# Patient Record
Sex: Female | Born: 1950 | Race: Black or African American | Hispanic: No | Marital: Married | Smoking: Never smoker
Health system: Southern US, Community
[De-identification: ages and names within clinical notes are randomized; demographics above are authoritative.]

## PROBLEM LIST (undated history)

## (undated) DIAGNOSIS — R001 Bradycardia, unspecified: Secondary | ICD-10-CM

## (undated) DIAGNOSIS — B1910 Unspecified viral hepatitis B without hepatic coma: Secondary | ICD-10-CM

## (undated) DIAGNOSIS — N760 Acute vaginitis: Secondary | ICD-10-CM

## (undated) DIAGNOSIS — J40 Bronchitis, not specified as acute or chronic: Secondary | ICD-10-CM

## (undated) DIAGNOSIS — M1710 Unilateral primary osteoarthritis, unspecified knee: Secondary | ICD-10-CM

## (undated) DIAGNOSIS — M8580 Other specified disorders of bone density and structure, unspecified site: Secondary | ICD-10-CM

## (undated) HISTORY — DX: Unspecified viral hepatitis B without hepatic coma: B19.10

## (undated) HISTORY — DX: Other specified disorders of bone density and structure, unspecified site: M85.80

## (undated) HISTORY — PX: BREAST BIOPSY: SHX20

## (undated) HISTORY — DX: Acute vaginitis: N76.0

## (undated) HISTORY — DX: Bradycardia, unspecified: R00.1

## (undated) HISTORY — DX: Unilateral primary osteoarthritis, unspecified knee: M17.10

## (undated) HISTORY — PX: COLONOSCOPY: SHX174

## (undated) HISTORY — DX: Bronchitis, not specified as acute or chronic: J40

---

## 2000-01-12 ENCOUNTER — Ambulatory Visit (HOSPITAL_COMMUNITY): Admission: RE | Admit: 2000-01-12 | Discharge: 2000-01-12 | Payer: Self-pay | Admitting: Obstetrics and Gynecology

## 2000-01-12 ENCOUNTER — Encounter: Payer: Self-pay | Admitting: Obstetrics and Gynecology

## 2000-02-03 ENCOUNTER — Other Ambulatory Visit: Admission: RE | Admit: 2000-02-03 | Discharge: 2000-02-03 | Payer: Self-pay | Admitting: Obstetrics

## 2000-02-03 ENCOUNTER — Encounter: Admission: RE | Admit: 2000-02-03 | Discharge: 2000-02-03 | Payer: Self-pay | Admitting: Obstetrics

## 2000-02-09 ENCOUNTER — Encounter (HOSPITAL_COMMUNITY): Admission: RE | Admit: 2000-02-09 | Discharge: 2000-05-09 | Payer: Self-pay | Admitting: *Deleted

## 2000-02-17 ENCOUNTER — Ambulatory Visit (HOSPITAL_COMMUNITY): Admission: RE | Admit: 2000-02-17 | Discharge: 2000-02-17 | Payer: Self-pay | Admitting: Obstetrics

## 2000-03-23 ENCOUNTER — Encounter: Admission: RE | Admit: 2000-03-23 | Discharge: 2000-03-23 | Payer: Self-pay | Admitting: Obstetrics & Gynecology

## 2000-03-25 ENCOUNTER — Encounter: Payer: Self-pay | Admitting: Emergency Medicine

## 2000-03-25 ENCOUNTER — Emergency Department (HOSPITAL_COMMUNITY): Admission: EM | Admit: 2000-03-25 | Discharge: 2000-03-25 | Payer: Self-pay | Admitting: Emergency Medicine

## 2000-04-27 ENCOUNTER — Encounter: Admission: RE | Admit: 2000-04-27 | Discharge: 2000-04-27 | Payer: Self-pay | Admitting: Obstetrics & Gynecology

## 2000-05-18 ENCOUNTER — Encounter: Admission: RE | Admit: 2000-05-18 | Discharge: 2000-05-18 | Payer: Self-pay | Admitting: Obstetrics & Gynecology

## 2000-07-08 ENCOUNTER — Encounter: Admission: RE | Admit: 2000-07-08 | Discharge: 2000-07-08 | Payer: Self-pay | Admitting: Obstetrics

## 2000-10-14 ENCOUNTER — Encounter: Admission: RE | Admit: 2000-10-14 | Discharge: 2000-10-14 | Payer: Self-pay | Admitting: Obstetrics

## 2001-02-17 ENCOUNTER — Encounter: Payer: Self-pay | Admitting: Obstetrics

## 2001-02-17 ENCOUNTER — Ambulatory Visit (HOSPITAL_COMMUNITY): Admission: RE | Admit: 2001-02-17 | Discharge: 2001-02-17 | Payer: Self-pay | Admitting: Obstetrics

## 2001-06-02 ENCOUNTER — Encounter: Admission: RE | Admit: 2001-06-02 | Discharge: 2001-06-02 | Payer: Self-pay | Admitting: Obstetrics

## 2002-01-11 ENCOUNTER — Encounter: Admission: RE | Admit: 2002-01-11 | Discharge: 2002-01-11 | Payer: Self-pay | Admitting: Family Medicine

## 2002-02-23 ENCOUNTER — Encounter: Payer: Self-pay | Admitting: Obstetrics

## 2002-02-23 ENCOUNTER — Ambulatory Visit (HOSPITAL_COMMUNITY): Admission: RE | Admit: 2002-02-23 | Discharge: 2002-02-23 | Payer: Self-pay | Admitting: Obstetrics

## 2003-01-12 ENCOUNTER — Encounter (INDEPENDENT_AMBULATORY_CARE_PROVIDER_SITE_OTHER): Payer: Self-pay | Admitting: *Deleted

## 2003-02-02 ENCOUNTER — Encounter: Admission: RE | Admit: 2003-02-02 | Discharge: 2003-02-02 | Payer: Self-pay | Admitting: Family Medicine

## 2003-02-28 ENCOUNTER — Encounter: Payer: Self-pay | Admitting: Obstetrics

## 2003-02-28 ENCOUNTER — Ambulatory Visit (HOSPITAL_COMMUNITY): Admission: RE | Admit: 2003-02-28 | Discharge: 2003-02-28 | Payer: Self-pay | Admitting: Obstetrics

## 2004-03-24 ENCOUNTER — Ambulatory Visit (HOSPITAL_COMMUNITY): Admission: RE | Admit: 2004-03-24 | Discharge: 2004-03-24 | Payer: Self-pay | Admitting: Internal Medicine

## 2004-05-28 ENCOUNTER — Encounter (INDEPENDENT_AMBULATORY_CARE_PROVIDER_SITE_OTHER): Payer: Self-pay | Admitting: Specialist

## 2004-05-28 ENCOUNTER — Ambulatory Visit (HOSPITAL_COMMUNITY): Admission: RE | Admit: 2004-05-28 | Discharge: 2004-05-28 | Payer: Self-pay | Admitting: Gastroenterology

## 2005-03-30 ENCOUNTER — Ambulatory Visit (HOSPITAL_COMMUNITY): Admission: RE | Admit: 2005-03-30 | Discharge: 2005-03-30 | Payer: Self-pay | Admitting: Internal Medicine

## 2005-07-14 ENCOUNTER — Ambulatory Visit (HOSPITAL_COMMUNITY): Admission: RE | Admit: 2005-07-14 | Discharge: 2005-07-14 | Payer: Self-pay | Admitting: Infectious Diseases

## 2006-01-18 ENCOUNTER — Encounter: Admission: RE | Admit: 2006-01-18 | Discharge: 2006-01-18 | Payer: Self-pay | Admitting: Internal Medicine

## 2006-04-05 ENCOUNTER — Ambulatory Visit (HOSPITAL_COMMUNITY): Admission: RE | Admit: 2006-04-05 | Discharge: 2006-04-05 | Payer: Self-pay | Admitting: Internal Medicine

## 2006-05-26 ENCOUNTER — Encounter: Admission: RE | Admit: 2006-05-26 | Discharge: 2006-05-26 | Payer: Self-pay | Admitting: Occupational Medicine

## 2006-06-14 ENCOUNTER — Encounter: Admission: RE | Admit: 2006-06-14 | Discharge: 2006-09-12 | Payer: Self-pay | Admitting: Orthopedic Surgery

## 2006-10-07 DIAGNOSIS — D259 Leiomyoma of uterus, unspecified: Secondary | ICD-10-CM | POA: Insufficient documentation

## 2006-10-07 DIAGNOSIS — E669 Obesity, unspecified: Secondary | ICD-10-CM

## 2006-10-08 ENCOUNTER — Encounter (INDEPENDENT_AMBULATORY_CARE_PROVIDER_SITE_OTHER): Payer: Self-pay | Admitting: *Deleted

## 2007-04-12 ENCOUNTER — Ambulatory Visit (HOSPITAL_COMMUNITY): Admission: RE | Admit: 2007-04-12 | Discharge: 2007-04-12 | Payer: Self-pay | Admitting: Internal Medicine

## 2008-04-19 ENCOUNTER — Ambulatory Visit (HOSPITAL_COMMUNITY): Admission: RE | Admit: 2008-04-19 | Discharge: 2008-04-19 | Payer: Self-pay | Admitting: Internal Medicine

## 2008-05-10 ENCOUNTER — Ambulatory Visit: Payer: Self-pay | Admitting: Obstetrics and Gynecology

## 2008-05-10 ENCOUNTER — Other Ambulatory Visit: Admission: RE | Admit: 2008-05-10 | Discharge: 2008-05-10 | Payer: Self-pay | Admitting: Obstetrics & Gynecology

## 2008-05-10 ENCOUNTER — Encounter: Payer: Self-pay | Admitting: Obstetrics and Gynecology

## 2008-05-15 ENCOUNTER — Ambulatory Visit (HOSPITAL_COMMUNITY): Admission: RE | Admit: 2008-05-15 | Discharge: 2008-05-15 | Payer: Self-pay | Admitting: Obstetrics and Gynecology

## 2008-05-24 ENCOUNTER — Ambulatory Visit: Payer: Self-pay | Admitting: Obstetrics & Gynecology

## 2008-06-28 ENCOUNTER — Emergency Department (HOSPITAL_COMMUNITY): Admission: EM | Admit: 2008-06-28 | Discharge: 2008-06-28 | Payer: Self-pay | Admitting: Emergency Medicine

## 2009-04-30 ENCOUNTER — Ambulatory Visit (HOSPITAL_COMMUNITY): Admission: RE | Admit: 2009-04-30 | Discharge: 2009-04-30 | Payer: Self-pay | Admitting: Internal Medicine

## 2010-05-05 ENCOUNTER — Ambulatory Visit (HOSPITAL_COMMUNITY): Admission: RE | Admit: 2010-05-05 | Discharge: 2010-05-05 | Payer: Self-pay | Admitting: Internal Medicine

## 2010-12-23 NOTE — Group Therapy Note (Signed)
Jessica Combs, Jessica Combs NO.:  [id number]   MEDICAL RECORD NO.:  [id number]          PATIENT TYPE:  WOC   LOCATION:  [facility]                   FACILITY:  [facility]   PHYSICIAN:  Argentina Donovan, MD        DATE OF BIRTH:  07-20-1951   DATE OF SERVICE:                                  CLINIC NOTE   The patient is a 60 year old [place] female, gravida 4, para 4-0-0-4, 4  years postmenopausal, who began in August with some spotting and  bleeding, almost every day in August and it stopped in September.  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  She is, otherwise, in good  health and she takes no medicines, except multivitamins.  We discussed  the problem with her and told her we need to do an endometrial biopsy.  The patient was placed in dorsolithotomy position.  The external  genitalia was normal.  BUS within normal limits.  Vagina was clean with  some loss of rugae.  The cervix is clean and slightly stenotic in  appearance with anterior normal size, shape, consistency.  Adnexa could  not be well outlined because of habitus.  The patient is 207 pounds and  5 feet 3 inches tall.  The small plastic dilator was used to dilate the  cervix, and the uterine cavity was sounded to a depth of 6 cm.  The  endometrial biopsy pipette was used and a moderate amount of tissue from  the uterine curettage and sent for pathological diagnosis.  Also, we  will get an uterine ultrasound on the patient to make sure the adnexa is  normal too.  We will have her come back in 2 weeks for results, rashes,  and postmenopausal bleeding.           ______________________________  Argentina Donovan, MD     PR/MEDQ  D:  05/10/2008  T:  05/11/2008  Job:  [id number]

## 2010-12-26 NOTE — Op Note (Signed)
NAMEJALISE, ZAWISTOWSKI        ACCOUNT NO.:  [id number]   MEDICAL RECORD NO.:  [id number]          PATIENT TYPE:  AMB   LOCATION:  ENDO                         FACILITY:  [facility]   PHYSICIAN:  Graylin Shiver, M.D.   DATE OF BIRTH:  01-Jan-1951   DATE OF PROCEDURE:  05/28/2004  DATE OF DISCHARGE:                                 OPERATIVE REPORT   PROCEDURE:  Colonoscopy with polypectomy.   ENDOSCOPIST:  Graylin Shiver, M.D.   INDICATIONS FOR PROCEDURE:  Screening.   INFORMED CONSENT:  An informed consent was obtained after the explanation of  the risks of bleeding, infection and perforation.   PREMEDICATION:  Fentanyl 62.5 mcg IV, Versed 5 mg IV.   DESCRIPTION OF PROCEDURE:  With the patient in the left lateral decubitus  position, a rectal examination was performed.  No masses were felt.  The  Olympus colonoscope was inserted into the rectum and advanced around the  colon to the cecum.  The cecal landmarks were identified.  The cecum and  ascending colon were normal.  The transverse colon was normal.  The  descending colon was normal.  The sigmoid showed a few diverticula.  In the  distal rectum there was a 5 mm sessile polyp, snared and removed by snare  cautery technique.  The cautery site looked good and the polyp was  retrieved.  She tolerated the procedure well without complications.   IMPRESSION:  1.  Diverticulosis.  2.  Rectal polyp - code #211.4.   PLAN:  The pathology will be checked.      SFG/MEDQ  D:  05/28/2004  T:  05/28/2004  Job:  [id number]   cc:   Drue Dun, M.D.

## 2011-04-23 ENCOUNTER — Other Ambulatory Visit (HOSPITAL_COMMUNITY): Payer: Self-pay | Admitting: Internal Medicine

## 2011-05-22 ENCOUNTER — Other Ambulatory Visit (HOSPITAL_COMMUNITY): Payer: Self-pay | Admitting: Internal Medicine

## 2011-06-03 ENCOUNTER — Other Ambulatory Visit (HOSPITAL_COMMUNITY): Payer: Self-pay | Admitting: Internal Medicine

## 2011-06-03 DIAGNOSIS — Z1231 Encounter for screening mammogram for malignant neoplasm of breast: Secondary | ICD-10-CM

## 2011-06-24 ENCOUNTER — Ambulatory Visit (HOSPITAL_COMMUNITY)
Admission: RE | Admit: 2011-06-24 | Discharge: 2011-06-24 | Disposition: A | Discharge status: Home / Self Care | Payer: No Typology Code available for payment source | Source: Ambulatory Visit | Attending: Internal Medicine | Admitting: Internal Medicine

## 2011-06-24 DIAGNOSIS — Z1231 Encounter for screening mammogram for malignant neoplasm of breast: Secondary | ICD-10-CM

## 2011-07-03 ENCOUNTER — Other Ambulatory Visit: Payer: Self-pay | Admitting: Internal Medicine

## 2011-07-03 DIAGNOSIS — R928 Other abnormal and inconclusive findings on diagnostic imaging of breast: Secondary | ICD-10-CM

## 2011-07-16 ENCOUNTER — Other Ambulatory Visit: Payer: Self-pay | Admitting: Obstetrics and Gynecology

## 2011-07-16 ENCOUNTER — Other Ambulatory Visit: Payer: Self-pay

## 2011-07-16 ENCOUNTER — Ambulatory Visit
Admission: RE | Admit: 2011-07-16 | Discharge: 2011-07-16 | Disposition: A | Discharge status: Home / Self Care | Payer: No Typology Code available for payment source | Source: Ambulatory Visit | Attending: Internal Medicine | Admitting: Internal Medicine

## 2011-07-16 DIAGNOSIS — R928 Other abnormal and inconclusive findings on diagnostic imaging of breast: Secondary | ICD-10-CM

## 2011-07-16 DIAGNOSIS — N63 Unspecified lump in unspecified breast: Secondary | ICD-10-CM

## 2011-07-29 ENCOUNTER — Other Ambulatory Visit: Payer: No Typology Code available for payment source

## 2011-08-10 ENCOUNTER — Other Ambulatory Visit: Payer: No Typology Code available for payment source

## 2011-09-25 ENCOUNTER — Ambulatory Visit
Admission: RE | Admit: 2011-09-25 | Discharge: 2011-09-25 | Disposition: A | Discharge status: Home / Self Care | Payer: No Typology Code available for payment source | Source: Ambulatory Visit | Attending: Obstetrics and Gynecology | Admitting: Obstetrics and Gynecology

## 2011-09-25 ENCOUNTER — Encounter (HOSPITAL_COMMUNITY): Payer: Self-pay

## 2011-09-25 ENCOUNTER — Ambulatory Visit (INDEPENDENT_AMBULATORY_CARE_PROVIDER_SITE_OTHER): Payer: Self-pay | Admitting: *Deleted

## 2011-09-25 ENCOUNTER — Other Ambulatory Visit: Payer: Self-pay | Admitting: Obstetrics and Gynecology

## 2011-09-25 VITALS — BP 138/91 | HR 74 | Temp 97.7°F | Ht 63.0 in | Wt 221.0 lb

## 2011-09-25 DIAGNOSIS — N63 Unspecified lump in unspecified breast: Secondary | ICD-10-CM

## 2011-09-25 DIAGNOSIS — N6310 Unspecified lump in the right breast, unspecified quadrant: Secondary | ICD-10-CM

## 2011-09-25 DIAGNOSIS — Z01419 Encounter for gynecological examination (general) (routine) without abnormal findings: Secondary | ICD-10-CM

## 2011-09-25 HISTORY — PX: BREAST BIOPSY: SHX20

## 2011-09-25 NOTE — Patient Instructions (Signed)
Taught patient how to perform BSE and gave educational materials to take home. Let her know [facility] will cover Pap smears every 3 years unless has a history of abnormal Pap smears. Patient is scheduled for a right breast biopsy this afternoon Friday, September 25, 2011 at 1300. Patient aware of appointment and will be there. Let patient know will follow up with her within the next couple weeks with results. Patient verbalized understanding.

## 2011-09-25 NOTE — Progress Notes (Signed)
Complaints of lump in right breast.  Pap Smear:    Completed Pap smear today. Last Pap smear was in 2011 per patient at [facility] and per patient was normal. Per patient no history of abnormal Pap smears. No Pap smear results in EPIC.  Physical exam: Breasts Breasts symmetrical. No skin abnormalities bilateral breasts. No nipple retraction bilateral breasts. No nipple discharge bilateral breasts. No lymphadenopathy. No lumps palpated within the left breast. Palpated a small lump in the right breast around 3:30 o'clock 5 cm from the nipple. No complaints of pain or tenderness on palpation. Patient is scheduled for a right breast biopsy today at 1300 at the [facility].         Pelvic/Bimanual   Ext Genitalia No lesions, no swelling and no discharge observed on external genitalia.         Vagina Vagina pink and normal texture. No lesions or discharge observed in vagina.          Cervix Cervix is present. Cervix pink and of normal texture. No discharge observed on the cervix.         Uterus Uterus is present and palpable. Uterus in normal position and normal size.       Adnexae Bilateral ovaries present and palpable. No tenderness on palpation.        Rectovaginal No rectal exam completed today since patient had no rectal complaints. No skin abnormalities observed on rectal area.

## 2011-10-08 ENCOUNTER — Encounter: Payer: Self-pay | Admitting: Obstetrics and Gynecology

## 2012-01-14 ENCOUNTER — Emergency Department (HOSPITAL_COMMUNITY): Payer: No Typology Code available for payment source

## 2012-01-14 ENCOUNTER — Emergency Department (HOSPITAL_COMMUNITY)
Admission: EM | Admit: 2012-01-14 | Discharge: 2012-01-14 | Disposition: A | Discharge status: Home / Self Care | Payer: No Typology Code available for payment source | Attending: Emergency Medicine | Admitting: Emergency Medicine

## 2012-01-14 ENCOUNTER — Encounter (HOSPITAL_COMMUNITY): Payer: Self-pay | Admitting: Emergency Medicine

## 2012-01-14 DIAGNOSIS — M542 Cervicalgia: Secondary | ICD-10-CM | POA: Insufficient documentation

## 2012-01-14 DIAGNOSIS — M25519 Pain in unspecified shoulder: Secondary | ICD-10-CM | POA: Insufficient documentation

## 2012-01-14 DIAGNOSIS — M62838 Other muscle spasm: Secondary | ICD-10-CM | POA: Insufficient documentation

## 2012-01-14 DIAGNOSIS — M25512 Pain in left shoulder: Secondary | ICD-10-CM

## 2012-01-14 DIAGNOSIS — Y92410 Unspecified street and highway as the place of occurrence of the external cause: Secondary | ICD-10-CM | POA: Insufficient documentation

## 2012-01-14 MED ORDER — CYCLOBENZAPRINE HCL 5 MG PO TABS: 5.0000 mg | ORAL_TABLET | Freq: Three times a day (TID) | ORAL | Status: AC | PRN

## 2012-01-14 NOTE — ED Provider Notes (Signed)
I saw and evaluated the patient, reviewed the resident's note and I agree with the findings and plan.   Mahlon Gabrielle, MD 01/14/12 2307 
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
## 2012-01-22 ENCOUNTER — Telehealth: Payer: Self-pay | Admitting: *Deleted

## 2012-01-22 NOTE — Telephone Encounter (Signed)
Patient called stating having right breast pain and describes as a dull pain. No lump or discharge from breast. Patient states has changed to a new bra but has not helped. Advised patient would check with Jessica Combs on Monday June 17 and see what options would be available.

## 2012-01-25 ENCOUNTER — Telehealth: Payer: Self-pay | Admitting: *Deleted

## 2012-01-25 NOTE — Telephone Encounter (Signed)
Patient called stating she is having occasional right breast pain. Patient states there are no new lumps. Asked patient if drinks caffeine and patient drinks coffee and tea daily. Recommended to patient that she decrease her caffeine intake. Told patient not to stop drinking caffeine abruptly due to possible headaches from caffeine withdrawal. Told patient if after cutting back or stops drinking caffeine still has the breast pain to call me back. Patient verbalized understanding.

## 2012-06-06 ENCOUNTER — Other Ambulatory Visit: Payer: Self-pay | Admitting: Internal Medicine

## 2012-06-06 DIAGNOSIS — Z1231 Encounter for screening mammogram for malignant neoplasm of breast: Secondary | ICD-10-CM

## 2012-06-28 ENCOUNTER — Ambulatory Visit
Admission: RE | Admit: 2012-06-28 | Discharge: 2012-06-28 | Disposition: A | Discharge status: Home / Self Care | Payer: No Typology Code available for payment source | Source: Ambulatory Visit | Attending: Internal Medicine | Admitting: Internal Medicine

## 2012-06-28 DIAGNOSIS — Z1231 Encounter for screening mammogram for malignant neoplasm of breast: Secondary | ICD-10-CM

## 2012-07-01 ENCOUNTER — Other Ambulatory Visit: Payer: Self-pay | Admitting: Internal Medicine

## 2012-07-01 DIAGNOSIS — R928 Other abnormal and inconclusive findings on diagnostic imaging of breast: Secondary | ICD-10-CM

## 2012-07-13 ENCOUNTER — Ambulatory Visit
Admission: RE | Admit: 2012-07-13 | Discharge: 2012-07-13 | Disposition: A | Discharge status: Home / Self Care | Payer: No Typology Code available for payment source | Source: Ambulatory Visit | Attending: Internal Medicine | Admitting: Internal Medicine

## 2012-07-13 DIAGNOSIS — R928 Other abnormal and inconclusive findings on diagnostic imaging of breast: Secondary | ICD-10-CM

## 2012-11-28 ENCOUNTER — Ambulatory Visit
Admission: RE | Admit: 2012-11-28 | Discharge: 2012-11-28 | Disposition: A | Discharge status: Home / Self Care | Payer: No Typology Code available for payment source | Source: Ambulatory Visit | Attending: *Deleted | Admitting: *Deleted

## 2012-11-28 ENCOUNTER — Other Ambulatory Visit: Payer: Self-pay | Admitting: *Deleted

## 2012-11-28 DIAGNOSIS — R7611 Nonspecific reaction to tuberculin skin test without active tuberculosis: Secondary | ICD-10-CM

## 2012-12-19 ENCOUNTER — Other Ambulatory Visit: Payer: Self-pay | Admitting: Internal Medicine

## 2012-12-19 DIAGNOSIS — N6489 Other specified disorders of breast: Secondary | ICD-10-CM

## 2012-12-30 ENCOUNTER — Other Ambulatory Visit: Payer: Self-pay | Admitting: *Deleted

## 2012-12-30 DIAGNOSIS — Z09 Encounter for follow-up examination after completed treatment for conditions other than malignant neoplasm: Secondary | ICD-10-CM

## 2013-01-03 ENCOUNTER — Ambulatory Visit (HOSPITAL_COMMUNITY)
Admission: RE | Admit: 2013-01-03 | Discharge: 2013-01-03 | Disposition: A | Discharge status: Home / Self Care | Payer: Self-pay | Source: Ambulatory Visit | Attending: Obstetrics and Gynecology | Admitting: Obstetrics and Gynecology

## 2013-01-03 ENCOUNTER — Encounter (HOSPITAL_COMMUNITY): Payer: Self-pay

## 2013-01-03 VITALS — BP 122/80 | Temp 98.1°F | Ht 63.0 in | Wt 206.8 lb

## 2013-01-03 DIAGNOSIS — Z1239 Encounter for other screening for malignant neoplasm of breast: Secondary | ICD-10-CM

## 2013-01-03 NOTE — Progress Notes (Signed)
No complaints today.  Pap Smear:    Pap smear not completed today. Last Pap smear was 09/25/2011 at [facility] and normal. Per patient no history of an abnormal Pap smear. Last Pap smear result in EPIC.  Physical exam: Breasts Breasts symmetrical. No skin abnormalities bilateral breasts. No nipple retraction bilateral breasts. No nipple discharge bilateral breasts. No lymphadenopathy. No lumps palpated bilateral breasts. No complaints of pain or tenderness on exam. Referred patient to the [facility] for 6 month follow up right breast diagnostic mammogram and possible ultrasound per recommendation. Appointment scheduled for Wednesday, January 11, 2013 at 1010.    Pelvic/Bimanual No Pap smear completed today since last Pap smear was 09/25/2011. Pap smear not indicated per BCCCP guidelines.

## 2013-01-03 NOTE — Patient Instructions (Signed)
Taught patient how to perform BSE. Patient did not need a Pap smear today due to last Pap smear was 09/25/2011. Let her know BCCCP will cover Pap smears every 3 years unless has a history of abnormal Pap smears. Referred patient to the [facility] for 6 month follow up right breast diagnostic mammogram and possible ultrasound per recommendation. Appointment scheduled for Wednesday, January 11, 2013 at 1010. Patient aware of appointment and will be there. Patient verbalized understanding.

## 2013-01-10 ENCOUNTER — Other Ambulatory Visit: Payer: Self-pay | Admitting: Oncology

## 2013-01-11 ENCOUNTER — Ambulatory Visit
Admission: RE | Admit: 2013-01-11 | Discharge: 2013-01-11 | Disposition: A | Discharge status: Home / Self Care | Payer: No Typology Code available for payment source | Source: Ambulatory Visit | Attending: Internal Medicine | Admitting: Internal Medicine

## 2013-01-11 DIAGNOSIS — N6489 Other specified disorders of breast: Secondary | ICD-10-CM

## 2013-06-19 ENCOUNTER — Other Ambulatory Visit: Payer: Self-pay

## 2013-06-19 DIAGNOSIS — Z1231 Encounter for screening mammogram for malignant neoplasm of breast: Secondary | ICD-10-CM

## 2013-07-20 ENCOUNTER — Ambulatory Visit
Admission: RE | Admit: 2013-07-20 | Discharge: 2013-07-20 | Disposition: A | Discharge status: Home / Self Care | Payer: No Typology Code available for payment source | Source: Ambulatory Visit

## 2013-07-20 DIAGNOSIS — Z1231 Encounter for screening mammogram for malignant neoplasm of breast: Secondary | ICD-10-CM

## 2013-10-16 ENCOUNTER — Other Ambulatory Visit (HOSPITAL_COMMUNITY): Payer: Self-pay | Admitting: General Practice

## 2013-10-16 ENCOUNTER — Ambulatory Visit (HOSPITAL_COMMUNITY)
Admission: RE | Admit: 2013-10-16 | Discharge: 2013-10-16 | Disposition: A | Discharge status: Home / Self Care | Payer: Self-pay | Source: Ambulatory Visit | Attending: General Practice | Admitting: General Practice

## 2013-10-16 DIAGNOSIS — R001 Bradycardia, unspecified: Secondary | ICD-10-CM

## 2013-10-16 DIAGNOSIS — I498 Other specified cardiac arrhythmias: Secondary | ICD-10-CM | POA: Insufficient documentation

## 2013-10-16 DIAGNOSIS — I517 Cardiomegaly: Secondary | ICD-10-CM | POA: Insufficient documentation

## 2014-06-11 ENCOUNTER — Encounter (HOSPITAL_COMMUNITY): Payer: Self-pay

## 2015-01-03 ENCOUNTER — Ambulatory Visit (INDEPENDENT_AMBULATORY_CARE_PROVIDER_SITE_OTHER): Payer: PRIVATE HEALTH INSURANCE | Admitting: Nurse Practitioner

## 2015-01-03 ENCOUNTER — Encounter: Payer: Self-pay | Admitting: Nurse Practitioner

## 2015-01-03 VITALS — BP 118/78 | HR 61 | Temp 98.4°F | Resp 20 | Ht 62.01 in | Wt 215.2 lb

## 2015-01-03 DIAGNOSIS — Z0000 Encounter for general adult medical examination without abnormal findings: Secondary | ICD-10-CM | POA: Diagnosis not present

## 2015-01-03 DIAGNOSIS — Z1211 Encounter for screening for malignant neoplasm of colon: Secondary | ICD-10-CM

## 2015-01-03 DIAGNOSIS — Z1239 Encounter for other screening for malignant neoplasm of breast: Secondary | ICD-10-CM

## 2015-01-03 DIAGNOSIS — Z23 Encounter for immunization: Secondary | ICD-10-CM

## 2015-01-03 MED ORDER — TETANUS-DIPHTH-ACELL PERTUSSIS 5-2.5-18.5 LF-MCG/0.5 IM SUSP: 0.5000 mL | Freq: Once | INTRAMUSCULAR | Status: DC

## 2015-01-03 NOTE — Patient Instructions (Signed)
Fasting blood work tomorrow Need screening colonoscopy and mammogram      Health Maintenance Adopting a healthy lifestyle and getting preventive care can go a long way to promote health and wellness. Talk with your health care provider about what schedule of regular examinations is right for you. This is a good chance for you to check in with your provider about disease prevention and staying healthy. In between checkups, there are plenty of things you can do on your own. Experts have done a lot of research about which lifestyle changes and preventive measures are most likely to keep you healthy. Ask your health care provider for more information. WEIGHT AND DIET  Eat a healthy diet  Be sure to include plenty of vegetables, fruits, low-fat dairy products, and lean protein.  Do not eat a lot of foods high in solid fats, added sugars, or salt.  Get regular exercise. This is one of the most important things you can do for your health.  Most adults should exercise for at least 150 minutes each week. The exercise should increase your heart rate and make you sweat (moderate-intensity exercise).  Most adults should also do strengthening exercises at least twice a week. This is in addition to the moderate-intensity exercise.  Maintain a healthy weight  Body mass index (BMI) is a measurement that can be used to identify possible weight problems. It estimates body fat based on height and weight. Your health care provider can help determine your BMI and help you achieve or maintain a healthy weight.  For females 72 years of age and older:   A BMI below 18.5 is considered underweight.  A BMI of 18.5 to 24.9 is normal.  A BMI of 25 to 29.9 is considered overweight.  A BMI of 30 and above is considered obese.  Watch levels of cholesterol and blood lipids  You should start having your blood tested for lipids and cholesterol at 64 years of age, then have this test every 5 years.  You may  need to have your cholesterol levels checked more often if:  Your lipid or cholesterol levels are high.  You are older than 64 years of age.  You are at high risk for heart disease.  CANCER SCREENING   Lung Cancer  Lung cancer screening is recommended for adults 52-2 years old who are at high risk for lung cancer because of a history of smoking.  A yearly low-dose CT scan of the lungs is recommended for people who:  Currently smoke.  Have quit within the past 15 years.  Have at least a 30-pack-year history of smoking. A pack year is smoking an average of one pack of cigarettes a day for 1 year.  Yearly screening should continue until it has been 15 years since you quit.  Yearly screening should stop if you develop a health problem that would prevent you from having lung cancer treatment.  Breast Cancer  Practice breast self-awareness. This means understanding how your breasts normally appear and feel.  It also means doing regular breast self-exams. Let your health care provider know about any changes, no matter how small.  If you are in your 20s or 30s, you should have a clinical breast exam (CBE) by a health care provider every 1-3 years as part of a regular health exam.  If you are 58 or older, have a CBE every year. Also consider having a breast X-ray (mammogram) every year.  If you have a family history of breast  cancer, talk to your health care provider about genetic screening.  If you are at high risk for breast cancer, talk to your health care provider about having an MRI and a mammogram every year.  Breast cancer gene (BRCA) assessment is recommended for women who have family members with BRCA-related cancers. BRCA-related cancers include:  Breast.  Ovarian.  Tubal.  Peritoneal cancers.  Results of the assessment will determine the need for genetic counseling and BRCA1 and BRCA2 testing. Cervical Cancer Routine pelvic examinations to screen for cervical  cancer are no longer recommended for nonpregnant women who are considered low risk for cancer of the pelvic organs (ovaries, uterus, and vagina) and who do not have symptoms. A pelvic examination may be necessary if you have symptoms including those associated with pelvic infections. Ask your health care provider if a screening pelvic exam is right for you.   The Pap test is the screening test for cervical cancer for women who are considered at risk.  If you had a hysterectomy for a problem that was not cancer or a condition that could lead to cancer, then you no longer need Pap tests.  If you are older than 65 years, and you have had normal Pap tests for the past 10 years, you no longer need to have Pap tests.  If you have had past treatment for cervical cancer or a condition that could lead to cancer, you need Pap tests and screening for cancer for at least 20 years after your treatment.  If you no longer get a Pap test, assess your risk factors if they change (such as having a new sexual partner). This can affect whether you should start being screened again.  Some women have medical problems that increase their chance of getting cervical cancer. If this is the case for you, your health care provider may recommend more frequent screening and Pap tests.  The human papillomavirus (HPV) test is another test that may be used for cervical cancer screening. The HPV test looks for the virus that can cause cell changes in the cervix. The cells collected during the Pap test can be tested for HPV.  The HPV test can be used to screen women 31 years of age and older. Getting tested for HPV can extend the interval between normal Pap tests from three to five years.  An HPV test also should be used to screen women of any age who have unclear Pap test results.  After 64 years of age, women should have HPV testing as often as Pap tests.  Colorectal Cancer  This type of cancer can be detected and often  prevented.  Routine colorectal cancer screening usually begins at 64 years of age and continues through 64 years of age.  Your health care provider may recommend screening at an earlier age if you have risk factors for colon cancer.  Your health care provider may also recommend using home test kits to check for hidden blood in the stool.  A small camera at the end of a tube can be used to examine your colon directly (sigmoidoscopy or colonoscopy). This is done to check for the earliest forms of colorectal cancer.  Routine screening usually begins at age 42.  Direct examination of the colon should be repeated every 5-10 years through 64 years of age. However, you may need to be screened more often if early forms of precancerous polyps or small growths are found. Skin Cancer  Check your skin from head to toe  regularly.  Tell your health care provider about any new moles or changes in moles, especially if there is a change in a mole's shape or color.  Also tell your health care provider if you have a mole that is larger than the size of a pencil eraser.  Always use sunscreen. Apply sunscreen liberally and repeatedly throughout the day.  Protect yourself by wearing long sleeves, pants, a wide-brimmed hat, and sunglasses whenever you are outside. HEART DISEASE, DIABETES, AND HIGH BLOOD PRESSURE   Have your blood pressure checked at least every 1-2 years. High blood pressure causes heart disease and increases the risk of stroke.  If you are between 57 years and 25 years old, ask your health care provider if you should take aspirin to prevent strokes.  Have regular diabetes screenings. This involves taking a blood sample to check your fasting blood sugar level.  If you are at a normal weight and have a low risk for diabetes, have this test once every three years after 64 years of age.  If you are overweight and have a high risk for diabetes, consider being tested at a younger age or more  often. PREVENTING INFECTION  Hepatitis B  If you have a higher risk for hepatitis B, you should be screened for this virus. You are considered at high risk for hepatitis B if:  You were born in a country where hepatitis B is common. Ask your health care provider which countries are considered high risk.  Your parents were born in a high-risk country, and you have not been immunized against hepatitis B (hepatitis B vaccine).  You have HIV or AIDS.  You use needles to inject street drugs.  You live with someone who has hepatitis B.  You have had sex with someone who has hepatitis B.  You get hemodialysis treatment.  You take certain medicines for conditions, including cancer, organ transplantation, and autoimmune conditions. Hepatitis C  Blood testing is recommended for:  Everyone born from [birth year] through 1965.  Anyone with known risk factors for hepatitis C. Sexually transmitted infections (STIs)  You should be screened for sexually transmitted infections (STIs) including gonorrhea and chlamydia if:  You are sexually active and are younger than 64 years of age.  You are older than 64 years of age and your health care provider tells you that you are at risk for this type of infection.  Your sexual activity has changed since you were last screened and you are at an increased risk for chlamydia or gonorrhea. Ask your health care provider if you are at risk.  If you do not have HIV, but are at risk, it may be recommended that you take a prescription medicine daily to prevent HIV infection. This is called pre-exposure prophylaxis (PrEP). You are considered at risk if:  You are sexually active and do not regularly use condoms or know the HIV status of your partner(s).  You take drugs by injection.  You are sexually active with a partner who has HIV. Talk with your health care provider about whether you are at high risk of being infected with HIV. If you choose to begin PrEP, you  should first be tested for HIV. You should then be tested every 3 months for as long as you are taking PrEP.  PREGNANCY   If you are premenopausal and you may become pregnant, ask your health care provider about preconception counseling.  If you may become pregnant, take 400 to 800 micrograms (mcg) of  folic acid every day.  If you want to prevent pregnancy, talk to your health care provider about birth control (contraception). OSTEOPOROSIS AND MENOPAUSE   Osteoporosis is a disease in which the bones lose minerals and strength with aging. This can result in serious bone fractures. Your risk for osteoporosis can be identified using a bone density scan.  If you are 18 years of age or older, or if you are at risk for osteoporosis and fractures, ask your health care provider if you should be screened.  Ask your health care provider whether you should take a calcium or vitamin D supplement to lower your risk for osteoporosis.  Menopause may have certain physical symptoms and risks.  Hormone replacement therapy may reduce some of these symptoms and risks. Talk to your health care provider about whether hormone replacement therapy is right for you.  HOME CARE INSTRUCTIONS   Schedule regular health, dental, and eye exams.  Stay current with your immunizations.   Do not use any tobacco products including cigarettes, chewing tobacco, or electronic cigarettes.  If you are pregnant, do not drink alcohol.  If you are breastfeeding, limit how much and how often you drink alcohol.  Limit alcohol intake to no more than 1 drink per day for nonpregnant women. One drink equals 12 ounces of beer, 5 ounces of wine, or 1 ounces of hard liquor.  Do not use street drugs.  Do not share needles.  Ask your health care provider for help if you need support or information about quitting drugs.  Tell your health care provider if you often feel depressed.  Tell your health care provider if you have ever  been abused or do not feel safe at home. Document Released: 02/09/2011 Document Revised: 12/11/2013 Document Reviewed: 06/28/2013 [facility] Patient Information 2015 [place], [place]. This information is not intended to replace advice given to you by your health care provider. Make sure you discuss any questions you have with your health care provider.

## 2015-01-03 NOTE — Progress Notes (Signed)
Patient ID: Jessica Combs, female   DOB: [birth year], 64 y.o.   MRN: [id number]    PCP: Lauree Chandler, NP  No Known Allergies  Chief Complaint  Patient presents with  . Establish Care     HPI: Patient is a 64 y.o. female seen in the office today to establish care, has changed insurances so needed to change offices. Pt has no complaints today.  Parents died of old age, unknown if any medical issues  Screenings: Colon Cancer- last colonoscopy 12 years ago, did not have insurance so that is why she is over due Breast Cancer-over due for mammogram (when she had insurance she was up to date)  Cervical Cancer- PAP 2 years ago   Vaccines Up to date on:  Influenza, needs TDAP  Smoking status: never smoked Alcohol use: does not drink alcohol  Dentist: every  6 months routinely Ophthalmologist: every year  Exercise regimen: walks every day for over an hour Diet: heart healthy Pt reports she has borderline high cholesterol   Advanced Directive information Does patient have an advance directive?: No, Would patient like information on creating an advanced directive?: Yes - Educational materials given Review of Systems:  Review of Systems  Constitutional: Negative for activity change, appetite change, fatigue and unexpected weight change.  HENT: Negative for congestion and hearing loss.   Eyes: Negative.   Respiratory: Negative for cough and shortness of breath.   Cardiovascular: Negative for chest pain, palpitations and leg swelling.  Gastrointestinal: Negative for abdominal pain, diarrhea and constipation.  Genitourinary: Negative for dysuria and difficulty urinating.  Musculoskeletal: Negative for myalgias and arthralgias.  Skin: Negative for color change and wound.  Neurological: Negative for dizziness and weakness.  Psychiatric/Behavioral: Negative for behavioral problems, confusion and agitation.    History reviewed. No pertinent past medical history. History  reviewed. No pertinent past surgical history. Social History:   reports that she has never smoked. She has never used smokeless tobacco. She reports that she does not drink alcohol or use illicit drugs.  History reviewed. No pertinent family history.  Medications: Patient's Medications  New Prescriptions   No medications on file  Previous Medications   MULTIPLE VITAMIN (MULTIVITAMIN) TABLET    Take 1 tablet by mouth daily.  Modified Medications   Modified Medication Previous Medication   TDAP (BOOSTRIX) 5-2.5-18.5 LF-MCG/0.5 INJECTION Tdap (BOOSTRIX) 5-2.5-18.5 LF-MCG/0.5 injection      Inject 0.5 mLs into the muscle once.    Inject 0.5 mLs into the muscle once.  Discontinued Medications   No medications on file     Physical Exam:  Filed Vitals:   01/03/15 1316  BP: 118/78  Pulse: 61  Temp: 98.4 F (36.9 C)  TempSrc: Oral  Resp: 20  Height: 5' 2.01" (1.575 m)  Weight: 215 lb 3.2 oz (97.614 kg)  SpO2: 95%    Physical Exam  Constitutional: She is oriented to person, place, and time. She appears well-developed and well-nourished. No distress.  HENT:  Head: Normocephalic and atraumatic.  Right Ear: External ear normal.  Left Ear: External ear normal.  Nose: Nose normal.  Mouth/Throat: Oropharynx is clear and moist. No oropharyngeal exudate.  Eyes: Conjunctivae are normal. Pupils are equal, round, and reactive to light.  Neck: Normal range of motion. Neck supple.  Cardiovascular: Normal rate, regular rhythm and normal heart sounds.   Pulmonary/Chest: Effort normal and breath sounds normal.  Abdominal: Soft. Bowel sounds are normal.  Musculoskeletal: Normal range of motion. She exhibits no edema or  tenderness.  Neurological: She is alert and oriented to person, place, and time. She has normal reflexes. No cranial nerve deficit.  Skin: Skin is warm and dry. She is not diaphoretic.  Psychiatric: She has a normal mood and affect. Her behavior is normal.    Labs  reviewed: Basic Metabolic Panel: No results for input(s): NA, K, CL, CO2, GLUCOSE, BUN, CREATININE, CALCIUM, MG, PHOS, TSH in the last 8760 hours. Liver Function Tests: No results for input(s): AST, ALT, ALKPHOS, BILITOT, PROT, ALBUMIN in the last 8760 hours. No results for input(s): LIPASE, AMYLASE in the last 8760 hours. No results for input(s): AMMONIA in the last 8760 hours. CBC: No results for input(s): WBC, NEUTROABS, HGB, HCT, MCV, PLT in the last 8760 hours. Lipid Panel: No results for input(s): CHOL, HDL, LDLCALC, TRIG, CHOLHDL, LDLDIRECT in the last 8760 hours. TSH: No results for input(s): TSH in the last 8760 hours. A1C: No results found for: HGBA1C   Assessment/Plan 1. Preventative health care Normal exam PREVENTIVE COUNSELING:  The patient was counseled regarding the appropriate use of alcohol, regular self-examination of the breasts on a monthly basis, prevention of dental and periodontal disease, diet, regular sustained exercise for at least 30 minutes 5 times per week, routine screening interval for mammogram as recommended by the [place] and ACOG, importance of regular PAP smears,  and recommended schedule for GI hemoccult testing, colonoscopy, cholesterol, thyroid and diabetes screening. Pt will be due for PAP next year - Comprehensive metabolic panel; Future - Lipid panel; Future - CBC with Differential; Future  2. Special screening for malignant neoplasms, colon - Ambulatory referral to Gastroenterology  3. Screening for breast cancer - MM DIGITAL SCREENING BILATERAL; Future  4. Need for diphtheria-tetanus-pertussis (Tdap) vaccine, adult/adolescent -tdap vaccine Rx given to be obtained at the pharmacy  Follow up in 1 year, sooner if needed

## 2015-01-04 ENCOUNTER — Other Ambulatory Visit: Payer: PRIVATE HEALTH INSURANCE

## 2015-01-04 DIAGNOSIS — Z0000 Encounter for general adult medical examination without abnormal findings: Secondary | ICD-10-CM

## 2015-01-05 LAB — COMPREHENSIVE METABOLIC PANEL
ALK PHOS: 72 IU/L (ref 39–117)
ALT: 13 IU/L (ref 0–32)
AST: 18 IU/L (ref 0–40)
Albumin/Globulin Ratio: 1.7 (ref 1.1–2.5)
Albumin: 4 g/dL (ref 3.6–4.8)
BILIRUBIN TOTAL: 0.3 mg/dL (ref 0.0–1.2)
BUN / CREAT RATIO: 23 (ref 11–26)
BUN: 16 mg/dL (ref 8–27)
CALCIUM: 9 mg/dL (ref 8.7–10.3)
CHLORIDE: 105 mmol/L (ref 97–108)
CO2: 24 mmol/L (ref 18–29)
Creatinine, Ser: 0.7 mg/dL (ref 0.57–1.00)
GFR calc Af Amer: 107 mL/min/{1.73_m2} (ref >59)
GFR, EST NON AFRICAN AMERICAN: 93 mL/min/{1.73_m2} (ref >59)
Globulin, Total: 2.3 g/dL (ref 1.5–4.5)
Glucose: 89 mg/dL (ref 65–99)
Potassium: 4.1 mmol/L (ref 3.5–5.2)
Sodium: 142 mmol/L (ref 134–144)
Total Protein: 6.3 g/dL (ref 6.0–8.5)

## 2015-01-05 LAB — CBC WITH DIFFERENTIAL/PLATELET
Basophils Absolute: 0 10*3/uL (ref 0.0–0.2)
Basos: 1 %
EOS (ABSOLUTE): 0.1 10*3/uL (ref 0.0–0.4)
EOS: 3 %
Hematocrit: 38 % (ref 34.0–46.6)
Hemoglobin: 12.4 g/dL (ref 11.1–15.9)
Immature Grans (Abs): 0 10*3/uL (ref 0.0–0.1)
Immature Granulocytes: 0 %
Lymphocytes Absolute: 1.7 10*3/uL (ref 0.7–3.1)
Lymphs: 56 %
MCH: 29.6 pg (ref 26.6–33.0)
MCHC: 32.6 g/dL (ref 31.5–35.7)
MCV: 91 fL (ref 79–97)
MONOCYTES: 7 %
Monocytes Absolute: 0.2 10*3/uL (ref 0.1–0.9)
NEUTROS ABS: 1 10*3/uL — AB (ref 1.4–7.0)
Neutrophils: 33 %
Platelets: 151 10*3/uL (ref 150–379)
RBC: 4.19 x10E6/uL (ref 3.77–5.28)
RDW: 14.5 % (ref 12.3–15.4)
WBC: 3 10*3/uL — ABNORMAL LOW (ref 3.4–10.8)

## 2015-01-05 LAB — LIPID PANEL
CHOLESTEROL TOTAL: 208 mg/dL — AB (ref 100–199)
Chol/HDL Ratio: 3.2 ratio units (ref 0.0–4.4)
HDL: 66 mg/dL (ref >39)
LDL Calculated: 127 mg/dL — ABNORMAL HIGH (ref 0–99)
TRIGLYCERIDES: 75 mg/dL (ref 0–149)
VLDL Cholesterol Cal: 15 mg/dL (ref 5–40)

## 2015-02-07 ENCOUNTER — Ambulatory Visit
Admission: RE | Admit: 2015-02-07 | Discharge: 2015-02-07 | Disposition: A | Discharge status: Home / Self Care | Payer: PRIVATE HEALTH INSURANCE | Source: Ambulatory Visit | Attending: Nurse Practitioner | Admitting: Nurse Practitioner

## 2015-02-07 DIAGNOSIS — Z1239 Encounter for other screening for malignant neoplasm of breast: Secondary | ICD-10-CM

## 2015-02-25 ENCOUNTER — Encounter: Payer: Self-pay | Admitting: Nurse Practitioner

## 2015-05-07 ENCOUNTER — Encounter: Payer: Self-pay | Admitting: Nurse Practitioner

## 2015-10-03 ENCOUNTER — Encounter: Payer: Self-pay | Admitting: Nurse Practitioner

## 2016-01-07 ENCOUNTER — Encounter: Payer: PRIVATE HEALTH INSURANCE | Admitting: Nurse Practitioner

## 2016-01-28 ENCOUNTER — Encounter: Payer: Self-pay | Admitting: Nurse Practitioner

## 2016-01-28 ENCOUNTER — Ambulatory Visit (INDEPENDENT_AMBULATORY_CARE_PROVIDER_SITE_OTHER): Payer: PRIVATE HEALTH INSURANCE | Admitting: Nurse Practitioner

## 2016-01-28 ENCOUNTER — Other Ambulatory Visit: Payer: Self-pay | Admitting: Nurse Practitioner

## 2016-01-28 VITALS — BP 138/88 | HR 50 | Temp 98.0°F | Resp 17 | Ht 62.0 in | Wt 211.0 lb

## 2016-01-28 DIAGNOSIS — E2839 Other primary ovarian failure: Secondary | ICD-10-CM | POA: Diagnosis not present

## 2016-01-28 DIAGNOSIS — R001 Bradycardia, unspecified: Secondary | ICD-10-CM

## 2016-01-28 DIAGNOSIS — Z0000 Encounter for general adult medical examination without abnormal findings: Secondary | ICD-10-CM

## 2016-01-28 DIAGNOSIS — Z1239 Encounter for other screening for malignant neoplasm of breast: Secondary | ICD-10-CM | POA: Diagnosis not present

## 2016-01-28 DIAGNOSIS — Z1211 Encounter for screening for malignant neoplasm of colon: Secondary | ICD-10-CM

## 2016-01-28 DIAGNOSIS — E785 Hyperlipidemia, unspecified: Secondary | ICD-10-CM

## 2016-01-28 DIAGNOSIS — Z124 Encounter for screening for malignant neoplasm of cervix: Secondary | ICD-10-CM

## 2016-01-28 MED ORDER — ZOSTER VACCINE LIVE 19400 UNT/0.65ML ~~LOC~~ SUSR: 0.6500 mL | Freq: Once | SUBCUTANEOUS | Status: DC

## 2016-01-28 MED ORDER — TETANUS-DIPHTH-ACELL PERTUSSIS 5-2.5-18.5 LF-MCG/0.5 IM SUSP: 0.5000 mL | Freq: Once | INTRAMUSCULAR | Status: DC

## 2016-01-28 NOTE — Progress Notes (Signed)
Patient ID: Jessica Combs, female   DOB: Dec 01, 1950, 65 y.o.   MRN: [id number]    PCP: Lauree Chandler, NP  Advanced Directive information Does patient have an advance directive?: No, Would patient like information on creating an advanced directive?: No - patient declined information  No Known Allergies  Chief Complaint  Patient presents with  . Medical Management of Chronic Issues    Annual physical. (labs printed from 12/2014)     HPI: Patient is a 65 y.o. female seen in the office today for wellness exam. Pt has done well since last visit, no hospitalizations or major illness  Screenings: Colon Cancer- 13 years ago, due  Breast Cancer- mammogram yearly, due now Cervical Cancer- PAP 2014 Osteoporosis- Dexa Scan-due Depression screening Depression screen [facility] 2/9 01/28/2016 01/03/2015  Decreased Interest 0 0  Down, Depressed, Hopeless 0 0  PHQ - 2 Score 0 0   Falls Fall Risk  01/28/2016 01/03/2015  Falls in the past year? No No   MMSE No flowsheet data found. Vaccines Immunization History  Administered Date(s) Administered  . Td 08/10/1998  Tdap not covered at pharmacy   Smoking status:.never Alcohol use: never  Dentist: every 6 months Ophthalmologist: yearly  Exercise regimen: walks- 1 hour daily Diet: following heart healthy diet, started diet last year Had lost weight but has gained some back  Pain in middle finger on right hand, had injury that effected ring finger in the past. Has been hurting for a few months now. Pain at a 9/10 at times, most of the time pain is a 3/10.  Has not taken anything for the pain.   Hx of bradycardia, had a stress test per cardiology without findings. No chest pains.   Review of Systems: Review of Systems  Constitutional: Negative for activity change, appetite change, fatigue and unexpected weight change.  HENT: Negative for congestion and hearing loss.   Eyes: Negative.   Respiratory: Negative for cough and  shortness of breath.   Cardiovascular: Negative for chest pain, palpitations and leg swelling.  Gastrointestinal: Negative for abdominal pain, diarrhea and constipation.  Genitourinary: Negative for dysuria and difficulty urinating.  Musculoskeletal: Negative for myalgias and arthralgias.  Skin: Negative for color change and wound.  Neurological: Negative for dizziness and weakness.  Psychiatric/Behavioral: Negative for behavioral problems, confusion and agitation.    Past Medical History  Diagnosis Date  . Bradycardia    History reviewed. No pertinent past surgical history. Social History:   reports that she has never smoked. She has never used smokeless tobacco. She reports that she does not drink alcohol or use illicit drugs.  History reviewed. No pertinent family history.  Medications: Patient's Medications  New Prescriptions   No medications on file  Previous Medications   MULTIPLE VITAMIN (MULTIVITAMIN) TABLET    Take 1 tablet by mouth daily.  Modified Medications   Modified Medication Previous Medication   TDAP (BOOSTRIX) 5-2.5-18.5 LF-MCG/0.5 INJECTION Tdap (BOOSTRIX) 5-2.5-18.5 LF-MCG/0.5 injection      Inject 0.5 mLs into the muscle once.    Inject 0.5 mLs into the muscle once.   ZOSTER VACCINE LIVE, PF, (ZOSTAVAX) 16109 UNT/0.65ML INJECTION Zoster Vaccine Live, PF, (ZOSTAVAX) 60454 UNT/0.65ML injection      Inject 19,400 Units into the skin once.    Inject 0.65 mLs into the skin once.  Discontinued Medications   No medications on file     Physical Exam:  Filed Vitals:   01/28/16 1111  BP: 138/88  Pulse: 50  Temp:  98 F (36.7 C)  TempSrc: Oral  Resp: 17  Height: 5\' 2"  (1.575 m)  Weight: 211 lb (95.709 kg)  SpO2: 99%   Body mass index is 38.58 kg/(m^2).  Physical Exam  Constitutional: She is oriented to person, place, and time. She appears well-developed and well-nourished. No distress.  HENT:  Head: Normocephalic and atraumatic.  Right Ear: External  ear normal.  Left Ear: External ear normal.  Nose: Nose normal.  Mouth/Throat: Oropharynx is clear and moist. No oropharyngeal exudate.  Eyes: Conjunctivae are normal. Pupils are equal, round, and reactive to light.  Neck: Normal range of motion. Neck supple.  Cardiovascular: Normal rate, regular rhythm and normal heart sounds.   Pulmonary/Chest: Effort normal and breath sounds normal.  Abdominal: Soft. Bowel sounds are normal.  Musculoskeletal: Normal range of motion. She exhibits no edema or tenderness.  Neurological: She is alert and oriented to person, place, and time. She has normal reflexes. No cranial nerve deficit.  Skin: Skin is warm and dry. She is not diaphoretic.  Psychiatric: She has a normal mood and affect. Her behavior is normal.    Labs reviewed: Basic Metabolic Panel: No results for input(s): NA, K, CL, CO2, GLUCOSE, BUN, CREATININE, CALCIUM, MG, PHOS, TSH in the last 8760 hours. Liver Function Tests: No results for input(s): AST, ALT, ALKPHOS, BILITOT, PROT, ALBUMIN in the last 8760 hours. No results for input(s): LIPASE, AMYLASE in the last 8760 hours. No results for input(s): AMMONIA in the last 8760 hours. CBC: No results for input(s): WBC, NEUTROABS, HGB, HCT, MCV, PLT in the last 8760 hours. Lipid Panel: No results for input(s): CHOL, HDL, LDLCALC, TRIG, CHOLHDL, LDLDIRECT in the last 8760 hours. TSH: No results for input(s): TSH in the last 8760 hours. A1C: No results found for: HGBA1C   Assessment/Plan 1. Preventative health care -The patient is doing well and no distinct problems were identified on exam.  The patient was counseled regarding the appropriate use of alcohol, regular self-examination of the breasts on a monthly basis, prevention of dental and periodontal disease, diet, regular sustained exercise for at least 30 minutes 5 times per week, routine screening interval for mammogram as recommended by the [place] and ACOG, importance  of regular PAP smears, tobacco use,  and recommended schedule for GI hemoccult testing, colonoscopy, cholesterol, thyroid and diabetes screening. - CBC with Differential/Platelets - Lipid panel - Comprehensive metabolic panel  2. Special screening for malignant neoplasms, colon - Ambulatory referral to Gastroenterology  3. Screening for breast cancer - MM DIGITAL SCREENING BILATERAL; Future  4. Estrogen deficiency - DG Bone Density; Future  5. Bradycardia - pt with hx of bradycardia, was previously followed by cardiology for this   EKG 12-Lead stable    Nasean Zapf K. Harle Battiest  [facility] [phone] 8 am - 5 pm) [phone] (after hours)

## 2016-01-29 LAB — CBC WITH DIFFERENTIAL/PLATELET
BASOS: 0 %
Basophils Absolute: 0 10*3/uL (ref 0.0–0.2)
EOS (ABSOLUTE): 0 10*3/uL (ref 0.0–0.4)
Eos: 1 %
HEMATOCRIT: 38.8 % (ref 34.0–46.6)
Hemoglobin: 13 g/dL (ref 11.1–15.9)
IMMATURE GRANULOCYTES: 0 %
Immature Grans (Abs): 0 10*3/uL (ref 0.0–0.1)
Lymphocytes Absolute: 1.6 10*3/uL (ref 0.7–3.1)
Lymphs: 56 %
MCH: 29.7 pg (ref 26.6–33.0)
MCHC: 33.5 g/dL (ref 31.5–35.7)
MCV: 89 fL (ref 79–97)
MONOS ABS: 0.2 10*3/uL (ref 0.1–0.9)
Monocytes: 8 %
NEUTROS PCT: 35 %
Neutrophils Absolute: 1 10*3/uL — ABNORMAL LOW (ref 1.4–7.0)
Platelets: 154 10*3/uL (ref 150–379)
RBC: 4.37 x10E6/uL (ref 3.77–5.28)
RDW: 14.4 % (ref 12.3–15.4)
WBC: 2.9 10*3/uL — AB (ref 3.4–10.8)

## 2016-01-29 LAB — COMPREHENSIVE METABOLIC PANEL
A/G RATIO: 1.5 (ref 1.2–2.2)
ALT: 13 IU/L (ref 0–32)
AST: 20 IU/L (ref 0–40)
Albumin: 4.1 g/dL (ref 3.6–4.8)
Alkaline Phosphatase: 78 IU/L (ref 39–117)
BUN/Creatinine Ratio: 20 (ref 12–28)
BUN: 15 mg/dL (ref 8–27)
Bilirubin Total: 0.4 mg/dL (ref 0.0–1.2)
CALCIUM: 9.3 mg/dL (ref 8.7–10.3)
CO2: 23 mmol/L (ref 18–29)
CREATININE: 0.75 mg/dL (ref 0.57–1.00)
Chloride: 106 mmol/L (ref 96–106)
GFR, EST AFRICAN AMERICAN: 97 mL/min/{1.73_m2} (ref >59)
GFR, EST NON AFRICAN AMERICAN: 85 mL/min/{1.73_m2} (ref >59)
Globulin, Total: 2.7 g/dL (ref 1.5–4.5)
Glucose: 89 mg/dL (ref 65–99)
POTASSIUM: 4.2 mmol/L (ref 3.5–5.2)
Sodium: 143 mmol/L (ref 134–144)
TOTAL PROTEIN: 6.8 g/dL (ref 6.0–8.5)

## 2016-01-29 LAB — LIPID PANEL
CHOL/HDL RATIO: 3.4 ratio (ref 0.0–4.4)
Cholesterol, Total: 232 mg/dL — ABNORMAL HIGH (ref 100–199)
HDL: 68 mg/dL (ref >39)
LDL Calculated: 148 mg/dL — ABNORMAL HIGH (ref 0–99)
TRIGLYCERIDES: 80 mg/dL (ref 0–149)
VLDL Cholesterol Cal: 16 mg/dL (ref 5–40)

## 2016-01-31 MED ORDER — PRAVASTATIN SODIUM 20 MG PO TABS: 20.0000 mg | ORAL_TABLET | Freq: Every day | ORAL | Status: DC

## 2016-01-31 NOTE — Addendum Note (Signed)
Addended by: Denyse Amass on: 01/31/2016 08:24 AM   Modules accepted: Orders, Medications

## 2016-02-06 LAB — GYN REPORT: PAP SMEAR COMMENT: 0

## 2016-02-06 LAB — SPECIMEN STATUS REPORT

## 2016-02-07 NOTE — Addendum Note (Signed)
Addended by: Denyse Amass on: 02/07/2016 01:16 PM   Modules accepted: Orders

## 2016-02-21 LAB — PAP LB (LIQUID-BASED): PAP SMEAR COMMENT: 0

## 2016-03-02 ENCOUNTER — Telehealth: Payer: Self-pay | Admitting: *Deleted

## 2016-03-02 NOTE — Telephone Encounter (Signed)
Patient called and stated that she had a  reaction to Pravastatin causing dizziness. Happens everytime she takes it, took it for 3 days. [place] and she stopped using it. Please Advise.

## 2016-03-02 NOTE — Telephone Encounter (Signed)
Patient is back from going out of town.

## 2016-03-02 NOTE — Telephone Encounter (Signed)
Have her notify us when she returns and we will try another medication

## 2016-03-03 MED ORDER — ATORVASTATIN CALCIUM 10 MG PO TABS: 10.0000 mg | ORAL_TABLET | Freq: Every day | ORAL | 3 refills | Status: DC

## 2016-03-03 NOTE — Telephone Encounter (Signed)
Patient notified and agreed. Medication list updated and sent to pharmacy.

## 2016-03-03 NOTE — Telephone Encounter (Signed)
To stop medication and try atorvastatin 10 mg daily at supper - please send to pharmacy of choice #30/1 refill

## 2016-03-12 ENCOUNTER — Encounter: Payer: Self-pay | Admitting: Nurse Practitioner

## 2016-03-16 ENCOUNTER — Ambulatory Visit
Admission: RE | Admit: 2016-03-16 | Discharge: 2016-03-16 | Disposition: A | Discharge status: Home / Self Care | Payer: PRIVATE HEALTH INSURANCE | Source: Ambulatory Visit | Attending: Nurse Practitioner | Admitting: Nurse Practitioner

## 2016-03-16 DIAGNOSIS — E2839 Other primary ovarian failure: Secondary | ICD-10-CM

## 2016-03-16 DIAGNOSIS — Z1239 Encounter for other screening for malignant neoplasm of breast: Secondary | ICD-10-CM

## 2016-03-17 ENCOUNTER — Other Ambulatory Visit: Payer: Self-pay

## 2016-03-17 ENCOUNTER — Other Ambulatory Visit: Payer: PRIVATE HEALTH INSURANCE

## 2016-03-17 DIAGNOSIS — E785 Hyperlipidemia, unspecified: Secondary | ICD-10-CM

## 2016-03-17 LAB — LIPID PANEL
CHOL/HDL RATIO: 2.5 ratio (ref <=5.0)
Cholesterol: 155 mg/dL (ref 125–200)
HDL: 63 mg/dL (ref >=46)
LDL CALC: 79 mg/dL (ref <130)
Triglycerides: 67 mg/dL (ref <150)
VLDL: 13 mg/dL (ref <30)

## 2016-03-24 ENCOUNTER — Encounter: Payer: Self-pay | Admitting: Nurse Practitioner

## 2016-03-24 ENCOUNTER — Ambulatory Visit (INDEPENDENT_AMBULATORY_CARE_PROVIDER_SITE_OTHER): Payer: PRIVATE HEALTH INSURANCE | Admitting: Nurse Practitioner

## 2016-03-24 VITALS — BP 132/84 | HR 64 | Temp 97.8°F | Resp 18 | Ht 62.0 in | Wt 210.2 lb

## 2016-03-24 DIAGNOSIS — E785 Hyperlipidemia, unspecified: Secondary | ICD-10-CM | POA: Diagnosis not present

## 2016-03-24 MED ORDER — ATORVASTATIN CALCIUM 10 MG PO TABS: 5.0000 mg | ORAL_TABLET | Freq: Every day | ORAL | 3 refills | Status: DC

## 2016-03-24 NOTE — Patient Instructions (Addendum)
May take 1/2 tablet of atorvastatin

## 2016-03-24 NOTE — Progress Notes (Signed)
Careteam: Patient Care Team: Lauree Chandler, NP as PCP - General (Nurse Practitioner)  Advanced Directive information Does patient have an advance directive?: No, Would patient like information on creating an advanced directive?: Yes - Educational materials given  Allergies  Allergen Reactions  . Pravastatin Other (See Comments)    Dizziness    Chief Complaint  Patient presents with  . Medical Management of Chronic Issues    Here to discuss labs. Labs printed.   . Other     HPI: Patient is a 65 y.o. female seen in the office today to follow up cholesterol. Pt with elevated LDL 1 months. Pt did not tolerated pravastatin and started Atorvastatin on July 27th.  While she was on pravastatin she noticed dizziness, stopped and it got better Started atorvastatin and no dizziness Started having a scratchy throat but does not feel like this is associated, taking cough drops and it has helped. No scratchy, tightness or sore throat at this time No shortness of breath, no swelling, no myalgias noted Was told she was supposed to be here after 1 month (lab notes stated 8 weeks) Also making lifestyle changes, walking and eating better  Does not sit down at work, walks constantly  Review of Systems:  Review of Systems  Constitutional: Negative for activity change, appetite change, fatigue and unexpected weight change.  HENT: Negative for congestion, dental problem and facial swelling.   Eyes: Negative.   Respiratory: Negative for cough and shortness of breath.   Cardiovascular: Negative for chest pain, palpitations and leg swelling.  Gastrointestinal: Negative for abdominal pain, constipation and diarrhea.  Genitourinary: Negative for difficulty urinating and dysuria.  Musculoskeletal: Negative for arthralgias and myalgias.  Skin: Negative for color change and wound.  Neurological: Negative for dizziness, weakness, light-headedness and headaches.  Psychiatric/Behavioral: Negative  for agitation, behavioral problems and confusion.    Past Medical History:  Diagnosis Date  . Bradycardia    History reviewed. No pertinent surgical history. Social History:   reports that she has never smoked. She has never used smokeless tobacco. She reports that she does not drink alcohol or use drugs.  History reviewed. No pertinent family history.  Medications: Patient's Medications  New Prescriptions   No medications on file  Previous Medications   ATORVASTATIN (LIPITOR) 10 MG TABLET    Take 1 tablet (10 mg total) by mouth daily. At Supper  Modified Medications   No medications on file  Discontinued Medications   MULTIPLE VITAMIN (MULTIVITAMIN) TABLET    Take 1 tablet by mouth daily.   TDAP (BOOSTRIX) 5-2.5-18.5 LF-MCG/0.5 INJECTION    Inject 0.5 mLs into the muscle once.   ZOSTER VACCINE LIVE, PF, (ZOSTAVAX) 60454 UNT/0.65ML INJECTION    Inject 19,400 Units into the skin once.     Physical Exam:  Vitals:   03/24/16 0944  BP: 132/84  Pulse: 64  Resp: 18  Temp: 97.8 F (36.6 C)  TempSrc: Oral  SpO2: 98%  Weight: 210 lb 3.2 oz (95.3 kg)  Height: 5\' 2"  (1.575 m)   Body mass index is 38.45 kg/m.  Physical Exam  Constitutional: She is oriented to person, place, and time. She appears well-developed and well-nourished. No distress.  HENT:  Head: Normocephalic and atraumatic.  Right Ear: External ear normal.  Nose: Nose normal.  Mouth/Throat: Oropharynx is clear and moist. No oropharyngeal exudate.  Eyes: Conjunctivae are normal. Pupils are equal, round, and reactive to light.  Neck: Neck supple.  Cardiovascular: Normal rate, regular rhythm  and normal heart sounds.   Pulmonary/Chest: Effort normal and breath sounds normal.  Musculoskeletal: She exhibits no edema.  Neurological: She is alert and oriented to person, place, and time.  Skin: Skin is warm and dry. She is not diaphoretic.  Psychiatric: She has a normal mood and affect. Her behavior is normal.     Labs reviewed: Basic Metabolic Panel:  Recent Labs  01/28/16 1157  NA 143  K 4.2  CL 106  CO2 23  GLUCOSE 89  BUN 15  CREATININE 0.75  CALCIUM 9.3   Liver Function Tests:  Recent Labs  01/28/16 1157  AST 20  ALT 13  ALKPHOS 78  BILITOT 0.4  PROT 6.8  ALBUMIN 4.1   No results for input(s): LIPASE, AMYLASE in the last 8760 hours. No results for input(s): AMMONIA in the last 8760 hours. CBC:  Recent Labs  01/28/16 1157  WBC 2.9*  NEUTROABS 1.0*  HCT 38.8  MCV 89  PLT 154   Lipid Panel:  Recent Labs  01/28/16 1157 03/17/16 0846  CHOL 232* 155  HDL 68 63  LDLCALC 148* 79  TRIG 80 67  CHOLHDL 3.4 2.5   TSH: No results for input(s): TSH in the last 8760 hours. A1C: No results found for: HGBA1C   Assessment/Plan 1. Hyperlipidemia LDL improved on atorvastatin after 2 weeks of therapy, pt does not want to be on medication so watching her diet and increasing activity at work -will decrease lipitor to 1/2 tablet at this time -cont lifestyle modifications - atorvastatin (LIPITOR) 10 MG tablet; Take 0.5 tablets (5 mg total) by mouth daily. At Supper  Dispense: 30 tablet; Refill: 3 - Lipid panel; Future - COMPLETE METABOLIC PANEL WITH GFR; Future  Follow up in 4 months on cholesterol  Annita Ratliff K. Harle Battiest  [facility] [phone] 8 am - 5 pm) [phone] (after hours)

## 2016-06-29 LAB — HM COLONOSCOPY

## 2016-07-21 ENCOUNTER — Other Ambulatory Visit: Payer: PRIVATE HEALTH INSURANCE

## 2016-07-21 DIAGNOSIS — E785 Hyperlipidemia, unspecified: Secondary | ICD-10-CM

## 2016-07-22 LAB — COMPLETE METABOLIC PANEL WITH GFR
ALT: 13 U/L (ref 6–29)
AST: 18 U/L (ref 10–35)
Albumin: 3.9 g/dL (ref 3.6–5.1)
Alkaline Phosphatase: 70 U/L (ref 33–130)
BILIRUBIN TOTAL: 0.6 mg/dL (ref 0.2–1.2)
BUN: 14 mg/dL (ref 7–25)
CO2: 26 mmol/L (ref 20–31)
Calcium: 8.9 mg/dL (ref 8.6–10.4)
Chloride: 110 mmol/L (ref 98–110)
Creat: 0.74 mg/dL (ref 0.50–0.99)
GFR, EST NON AFRICAN AMERICAN: 85 mL/min (ref >=60)
GFR, Est African American: >89 mL/min (ref >=60)
GLUCOSE: 96 mg/dL (ref 65–99)
Potassium: 3.9 mmol/L (ref 3.5–5.3)
SODIUM: 142 mmol/L (ref 135–146)
TOTAL PROTEIN: 6.3 g/dL (ref 6.1–8.1)

## 2016-07-22 LAB — LIPID PANEL
Cholesterol: 171 mg/dL (ref <200)
HDL: 63 mg/dL (ref >50)
LDL CALC: 92 mg/dL (ref <100)
Total CHOL/HDL Ratio: 2.7 Ratio (ref <5.0)
Triglycerides: 81 mg/dL (ref <150)
VLDL: 16 mg/dL (ref <30)

## 2016-07-28 ENCOUNTER — Encounter: Payer: Self-pay | Admitting: Nurse Practitioner

## 2016-07-28 ENCOUNTER — Ambulatory Visit (INDEPENDENT_AMBULATORY_CARE_PROVIDER_SITE_OTHER): Payer: PRIVATE HEALTH INSURANCE | Admitting: Nurse Practitioner

## 2016-07-28 VITALS — BP 122/84 | HR 62 | Temp 98.3°F | Resp 18 | Ht 62.0 in | Wt 215.0 lb

## 2016-07-28 DIAGNOSIS — E785 Hyperlipidemia, unspecified: Secondary | ICD-10-CM | POA: Diagnosis not present

## 2016-07-28 DIAGNOSIS — M25571 Pain in right ankle and joints of right foot: Secondary | ICD-10-CM | POA: Diagnosis not present

## 2016-07-28 NOTE — Progress Notes (Signed)
Careteam: Patient Care Team: Lauree Chandler, NP as PCP - General (Nurse Practitioner)  Advanced Directive information Does Patient Have a Medical Advance Directive?: No  Allergies  Allergen Reactions  . Pravastatin Other (See Comments)    Dizziness    Chief Complaint  Patient presents with  . Medical Management of Chronic Issues    4 month follow up. Labs printed.      HPI: Patient is a 65 y.o. female seen in the office today for a routine follow-up.  Pt forgot to take cholesterol medication while she was in [place] for 3 weeks.  Doing well.  Burning pain of right ankle that has been going on for almost 2 months. No pain at this time. But rates the pain 5-6/10 when it occurs. Worse at night. Nothing makes it better. Has not taken any medications to help the pain. Up on her feet much of the day and does a lot of walking. Wears good tennis shoes or [employer]. No known injury.   Review of Systems:  Review of Systems  Constitutional: Negative for activity change, appetite change, fatigue and unexpected weight change.  HENT: Negative for congestion, dental problem and facial swelling.   Eyes: Negative.   Respiratory: Negative for cough and shortness of breath.   Cardiovascular: Negative for chest pain, palpitations and leg swelling.  Gastrointestinal: Negative for abdominal pain, constipation and diarrhea.  Genitourinary: Negative for difficulty urinating and dysuria.  Musculoskeletal: Negative for myalgias.       Right ankle burning pain  Skin: Negative for color change and wound.  Neurological: Negative for dizziness, weakness, light-headedness and headaches.  Psychiatric/Behavioral: Negative for agitation, behavioral problems and confusion.    Past Medical History:  Diagnosis Date  . Bradycardia    History reviewed. No pertinent surgical history. Social History:   reports that she has never smoked. She has never used smokeless tobacco. She reports that she does not  drink alcohol or use drugs.  History reviewed. No pertinent family history.  Medications: Patient's Medications  New Prescriptions   No medications on file  Previous Medications   ATORVASTATIN (LIPITOR) 10 MG TABLET    Take 0.5 tablets (5 mg total) by mouth daily. At Supper  Modified Medications   No medications on file  Discontinued Medications   No medications on file     Physical Exam:  Vitals:   07/28/16 1028  BP: 122/84  Pulse: 62  Resp: 18  Temp: 98.3 F (36.8 C)  TempSrc: Oral  SpO2: 98%  Weight: 215 lb (97.5 kg)  Height: 5\' 2"  (1.575 m)   Body mass index is 39.32 kg/m.  Physical Exam  Constitutional: She is oriented to person, place, and time. She appears well-developed and well-nourished. No distress.  HENT:  Head: Normocephalic and atraumatic.  Mouth/Throat: Oropharynx is clear and moist. No oropharyngeal exudate.  Eyes: Conjunctivae are normal. Pupils are equal, round, and reactive to light.  Neck: Normal range of motion. Neck supple.  Cardiovascular: Normal rate, regular rhythm and normal heart sounds.   Pulmonary/Chest: Effort normal and breath sounds normal.  Abdominal: Soft. Bowel sounds are normal.  Musculoskeletal: Normal range of motion. She exhibits no edema or tenderness.       Right ankle: She exhibits normal range of motion, no swelling and no ecchymosis.  Neurological: She is alert and oriented to person, place, and time.  Skin: Skin is warm and dry. She is not diaphoretic.  Psychiatric: She has a normal mood and affect.  Her behavior is normal. Judgment and thought content normal.    Labs reviewed: Basic Metabolic Panel:  Recent Labs  01/28/16 1157 07/21/16 0833  NA 143 142  K 4.2 3.9  CL 106 110  CO2 23 26  GLUCOSE 89 96  BUN 15 14  CREATININE 0.75 0.74  CALCIUM 9.3 8.9   Liver Function Tests:  Recent Labs  01/28/16 1157 07/21/16 0833  AST 20 18  ALT 13 13  ALKPHOS 78 70  BILITOT 0.4 0.6  PROT 6.8 6.3  ALBUMIN 4.1  3.9   No results for input(s): LIPASE, AMYLASE in the last 8760 hours. No results for input(s): AMMONIA in the last 8760 hours. CBC:  Recent Labs  01/28/16 1157  WBC 2.9*  NEUTROABS 1.0*  HCT 38.8  MCV 89  PLT 154   Lipid Panel:  Recent Labs  01/28/16 1157 03/17/16 0846 07/21/16 0833  CHOL 232* 155 171  HDL 68 63 63  LDLCALC 148* 79 92  TRIG 80 67 81  CHOLHDL 3.4 2.5 2.7   TSH: No results for input(s): TSH in the last 8760 hours. A1C: No results found for: HGBA1C   Assessment/Plan 1. Hyperlipidemia, unspecified hyperlipidemia type - Controlled. LDL 92 and cholesterol 171 on 12/12.  - Continue dietary modifications, exercise, and current dosage of Lipitor.  2. Acute right ankle pain - Burning pain in right ankle that comes and goes with no known injury. Currently not having pain.  - Uric Acid - DG Ankle Complete Right   Follow-up in 4 months, sooner if needed   Yoko Mcgahee K. Harle Battiest  [facility] [phone] 8 am - 5 pm) [phone] (after hours)

## 2016-07-28 NOTE — Patient Instructions (Addendum)
Go to [facility] for an x-ray of the right ankle.   Continue diet and exercise to keep your cholesterol down.   We will notify you with the results of your labs and ankle x-ray.   Follow-up in 4 months.

## 2016-07-29 ENCOUNTER — Ambulatory Visit
Admission: RE | Admit: 2016-07-29 | Discharge: 2016-07-29 | Disposition: A | Discharge status: Home / Self Care | Payer: PRIVATE HEALTH INSURANCE | Source: Ambulatory Visit | Attending: Nurse Practitioner | Admitting: Nurse Practitioner

## 2016-07-29 LAB — URIC ACID: URIC ACID, SERUM: 5.8 mg/dL (ref 2.5–7.0)

## 2016-11-23 ENCOUNTER — Encounter: Payer: Self-pay | Admitting: Nurse Practitioner

## 2016-11-23 ENCOUNTER — Ambulatory Visit (INDEPENDENT_AMBULATORY_CARE_PROVIDER_SITE_OTHER): Payer: PRIVATE HEALTH INSURANCE | Admitting: Nurse Practitioner

## 2016-11-23 VITALS — BP 118/78 | HR 63 | Temp 98.1°F | Resp 17 | Ht 62.0 in | Wt 217.2 lb

## 2016-11-23 DIAGNOSIS — R001 Bradycardia, unspecified: Secondary | ICD-10-CM | POA: Diagnosis not present

## 2016-11-23 DIAGNOSIS — Z23 Encounter for immunization: Secondary | ICD-10-CM | POA: Diagnosis not present

## 2016-11-23 DIAGNOSIS — Z6839 Body mass index (BMI) 39.0-39.9, adult: Secondary | ICD-10-CM

## 2016-11-23 DIAGNOSIS — E785 Hyperlipidemia, unspecified: Secondary | ICD-10-CM | POA: Diagnosis not present

## 2016-11-23 DIAGNOSIS — E6609 Other obesity due to excess calories: Secondary | ICD-10-CM | POA: Diagnosis not present

## 2016-11-23 LAB — COMPLETE METABOLIC PANEL WITH GFR
ALT: 13 U/L (ref 6–29)
AST: 18 U/L (ref 10–35)
Albumin: 4.1 g/dL (ref 3.6–5.1)
Alkaline Phosphatase: 65 U/L (ref 33–130)
BILIRUBIN TOTAL: 0.5 mg/dL (ref 0.2–1.2)
BUN: 18 mg/dL (ref 7–25)
CO2: 28 mmol/L (ref 20–31)
CREATININE: 0.68 mg/dL (ref 0.50–0.99)
Calcium: 9.4 mg/dL (ref 8.6–10.4)
Chloride: 107 mmol/L (ref 98–110)
GFR, Est African American: >89 mL/min (ref >=60)
GLUCOSE: 78 mg/dL (ref 65–99)
Potassium: 4 mmol/L (ref 3.5–5.3)
SODIUM: 141 mmol/L (ref 135–146)
TOTAL PROTEIN: 6.8 g/dL (ref 6.1–8.1)

## 2016-11-23 LAB — LIPID PANEL
CHOLESTEROL: 179 mg/dL (ref <200)
HDL: 71 mg/dL (ref >50)
LDL CALC: 95 mg/dL (ref <100)
Total CHOL/HDL Ratio: 2.5 Ratio (ref <5.0)
Triglycerides: 67 mg/dL (ref <150)
VLDL: 13 mg/dL (ref <30)

## 2016-11-23 LAB — CBC WITH DIFFERENTIAL/PLATELET
BASOS ABS: 0 {cells}/uL (ref 0–200)
Basophils Relative: 0 %
EOS PCT: 2 %
Eosinophils Absolute: 60 cells/uL (ref 15–500)
HCT: 39.6 % (ref 35.0–45.0)
HEMOGLOBIN: 13 g/dL (ref 11.7–15.5)
Lymphocytes Relative: 53 %
Lymphs Abs: 1590 cells/uL (ref 850–3900)
MCH: 29.9 pg (ref 27.0–33.0)
MCHC: 32.8 g/dL (ref 32.0–36.0)
MCV: 91 fL (ref 80.0–100.0)
MONOS PCT: 6 %
MPV: 11.5 fL (ref 7.5–12.5)
Monocytes Absolute: 180 cells/uL — ABNORMAL LOW (ref 200–950)
NEUTROS PCT: 39 %
Neutro Abs: 1170 cells/uL — ABNORMAL LOW (ref 1500–7800)
Platelets: 150 10*3/uL (ref 140–400)
RBC: 4.35 MIL/uL (ref 3.80–5.10)
RDW: 14.2 % (ref 11.0–15.0)
WBC: 3 10*3/uL — ABNORMAL LOW (ref 3.8–10.8)

## 2016-11-23 MED ORDER — ATORVASTATIN CALCIUM 10 MG PO TABS
5.0000 mg | ORAL_TABLET | Freq: Every day | ORAL | 1 refills | Status: DC
Start: 2016-11-23 — End: 2017-06-14

## 2016-11-23 NOTE — Patient Instructions (Signed)
Follow up in 6 months for AWV and physical    DASH Eating Plan DASH stands for "Dietary Approaches to Stop Hypertension." The DASH eating plan is a healthy eating plan that has been shown to reduce high blood pressure (hypertension). It may also reduce your risk for type 2 diabetes, heart disease, and stroke. The DASH eating plan may also help with weight loss. What are tips for following this plan? General guidelines   Avoid eating more than 2,300 mg (milligrams) of salt (sodium) a day. If you have hypertension, you may need to reduce your sodium intake to 1,500 mg a day.  Limit alcohol intake to no more than 1 drink a day for nonpregnant women and 2 drinks a day for men. One drink equals 12 oz of beer, 5 oz of wine, or 1 oz of hard liquor.  Work with your health care provider to maintain a healthy body weight or to lose weight. Ask what an ideal weight is for you.  Get at least 30 minutes of exercise that causes your heart to beat faster (aerobic exercise) most days of the week. Activities may include walking, swimming, or biking.  Work with your health care provider or diet and nutrition specialist (dietitian) to adjust your eating plan to your individual calorie needs. Reading food labels   Check food labels for the amount of sodium per serving. Choose foods with less than 5 percent of the Daily Value of sodium. Generally, foods with less than 300 mg of sodium per serving fit into this eating plan.  To find whole grains, look for the word "whole" as the first word in the ingredient list. Shopping   Buy products labeled as "low-sodium" or "no salt added."  Buy fresh foods. Avoid canned foods and premade or frozen meals. Cooking   Avoid adding salt when cooking. Use salt-free seasonings or herbs instead of table salt or sea salt. Check with your health care provider or pharmacist before using salt substitutes.  Do not fry foods. Cook foods using healthy methods such as baking,  boiling, grilling, and broiling instead.  Cook with heart-healthy oils, such as olive, canola, soybean, or sunflower oil. Meal planning    Eat a balanced diet that includes:  5 or more servings of fruits and vegetables each day. At each meal, try to fill half of your plate with fruits and vegetables.  Up to 6-8 servings of whole grains each day.  Less than 6 oz of lean meat, poultry, or fish each day. A 3-oz serving of meat is about the same size as a deck of cards. One egg equals 1 oz.  2 servings of low-fat dairy each day.  A serving of nuts, seeds, or beans 5 times each week.  Heart-healthy fats. Healthy fats called Omega-3 fatty acids are found in foods such as flaxseeds and coldwater fish, like sardines, salmon, and mackerel.  Limit how much you eat of the following:  Canned or prepackaged foods.  Food that is high in trans fat, such as fried foods.  Food that is high in saturated fat, such as fatty meat.  Sweets, desserts, sugary drinks, and other foods with added sugar.  Full-fat dairy products.  Do not salt foods before eating.  Try to eat at least 2 vegetarian meals each week.  Eat more home-cooked food and less restaurant, buffet, and fast food.  When eating at a restaurant, ask that your food be prepared with less salt or no salt, if possible. What foods  are recommended? The items listed may not be a complete list. Talk with your dietitian about what dietary choices are best for you. Grains  Whole-grain or whole-wheat bread. Whole-grain or whole-wheat pasta. Brown rice. [place] Morrow. Bulgur. Whole-grain and low-sodium cereals. Pita bread. Low-fat, low-sodium crackers. Whole-wheat flour tortillas. Vegetables  Fresh or frozen vegetables (raw, steamed, roasted, or grilled). Low-sodium or reduced-sodium tomato and vegetable juice. Low-sodium or reduced-sodium tomato sauce and tomato paste. Low-sodium or reduced-sodium canned vegetables. Fruits  All fresh,  dried, or frozen fruit. Canned fruit in natural juice (without added sugar). Meat and other protein foods  Skinless chicken or [place]. Ground chicken or [place]. Pork with fat trimmed off. Fish and seafood. Egg whites. Dried beans, peas, or lentils. Unsalted nuts, nut butters, and seeds. Unsalted canned beans. Lean cuts of beef with fat trimmed off. Low-sodium, lean deli meat. Dairy  Low-fat (1%) or fat-free (skim) milk. Fat-free, low-fat, or reduced-fat cheeses. Nonfat, low-sodium ricotta or cottage cheese. Low-fat or nonfat yogurt. Low-fat, low-sodium cheese. Fats and oils  Soft margarine without trans fats. Vegetable oil. Low-fat, reduced-fat, or light mayonnaise and salad dressings (reduced-sodium). Canola, safflower, olive, soybean, and sunflower oils. Avocado. Seasoning and other foods  Herbs. Spices. Seasoning mixes without salt. Unsalted popcorn and pretzels. Fat-free sweets. What foods are not recommended? The items listed may not be a complete list. Talk with your dietitian about what dietary choices are best for you. Grains  Baked goods made with fat, such as croissants, muffins, or some breads. Dry pasta or rice meal packs. Vegetables  Creamed or fried vegetables. Vegetables in a cheese sauce. Regular canned vegetables (not low-sodium or reduced-sodium). Regular canned tomato sauce and paste (not low-sodium or reduced-sodium). Regular tomato and vegetable juice (not low-sodium or reduced-sodium). Angie Fava. Olives. Fruits  Canned fruit in a light or heavy syrup. Fried fruit. Fruit in cream or butter sauce. Meat and other protein foods  Fatty cuts of meat. Ribs. Fried meat. Berniece Salines. Sausage. Bologna and other processed lunch meats. Salami. Fatback. Hotdogs. Bratwurst. Salted nuts and seeds. Canned beans with added salt. Canned or smoked fish. Whole eggs or egg yolks. Chicken or [place] with skin. Dairy  Whole or 2% milk, cream, and half-and-half. Whole or full-fat cream cheese. Whole-fat or  sweetened yogurt. Full-fat cheese. Nondairy creamers. Whipped toppings. Processed cheese and cheese spreads. Fats and oils  Butter. Stick margarine. Lard. Shortening. Ghee. Bacon fat. Tropical oils, such as coconut, palm kernel, or palm oil. Seasoning and other foods  Salted popcorn and pretzels. Onion salt, garlic salt, seasoned salt, table salt, and sea salt. Worcestershire sauce. Tartar sauce. Barbecue sauce. Teriyaki sauce. Soy sauce, including reduced-sodium. Steak sauce. Canned and packaged gravies. Fish sauce. Oyster sauce. Cocktail sauce. Horseradish that you find on the shelf. Ketchup. Mustard. Meat flavorings and tenderizers. Bouillon cubes. Hot sauce and Tabasco sauce. Premade or packaged marinades. Premade or packaged taco seasonings. Relishes. Regular salad dressings. Where to find more information:  National Heart, Lung, and [place]: https://wilson-eaton.com/  American Heart Association: www.heart.org Summary  The DASH eating plan is a healthy eating plan that has been shown to reduce high blood pressure (hypertension). It may also reduce your risk for type 2 diabetes, heart disease, and stroke.  With the DASH eating plan, you should limit salt (sodium) intake to 2,300 mg a day. If you have hypertension, you may need to reduce your sodium intake to 1,500 mg a day.  When on the DASH eating plan, aim to eat more fresh fruits and  vegetables, whole grains, lean proteins, low-fat dairy, and heart-healthy fats.  Work with your health care provider or diet and nutrition specialist (dietitian) to adjust your eating plan to your individual calorie needs. This information is not intended to replace advice given to you by your health care provider. Make sure you discuss any questions you have with your health care provider. Document Released: 07/16/2011 Document Revised: 07/20/2016 Document Reviewed: 07/20/2016 Elsevier Interactive Patient Education  2017 [employer].
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
## 2016-11-24 ENCOUNTER — Telehealth: Payer: Self-pay

## 2016-11-24 NOTE — Telephone Encounter (Signed)
I called patient to find out who her previous provider was so that we can request her medical records. Patient has signed a [place] but she did not include her previous providers name or phone number.   Left a message asking that patient call the office to discuss getting medical release form completed.

## 2016-11-30 NOTE — Telephone Encounter (Signed)
Patient was mailed a medical release form. She stated that she would return it next week once she gets back into town.

## 2017-01-27 DIAGNOSIS — M8588 Other specified disorders of bone density and structure, other site: Secondary | ICD-10-CM | POA: Insufficient documentation

## 2017-01-27 DIAGNOSIS — M8580 Other specified disorders of bone density and structure, unspecified site: Secondary | ICD-10-CM

## 2017-01-27 HISTORY — DX: Other specified disorders of bone density and structure, unspecified site: M85.80

## 2017-03-15 ENCOUNTER — Other Ambulatory Visit: Payer: Self-pay | Admitting: Nurse Practitioner

## 2017-03-15 DIAGNOSIS — Z1231 Encounter for screening mammogram for malignant neoplasm of breast: Secondary | ICD-10-CM

## 2017-03-18 ENCOUNTER — Ambulatory Visit: Payer: PRIVATE HEALTH INSURANCE

## 2017-03-31 ENCOUNTER — Ambulatory Visit: Payer: PRIVATE HEALTH INSURANCE

## 2017-04-07 ENCOUNTER — Ambulatory Visit
Admission: RE | Admit: 2017-04-07 | Discharge: 2017-04-07 | Disposition: A | Discharge status: Home / Self Care | Payer: PRIVATE HEALTH INSURANCE | Source: Ambulatory Visit | Attending: Nurse Practitioner | Admitting: Nurse Practitioner

## 2017-04-07 DIAGNOSIS — Z1231 Encounter for screening mammogram for malignant neoplasm of breast: Secondary | ICD-10-CM

## 2017-04-09 IMAGING — CR DG ANKLE COMPLETE 3+V*R*
3 series · 3 of 3 positions shown · non-contrast
Comparison: None.

CLINICAL DATA: Right ankle lateral malleolar pain for 1 month

EXAM:
RIGHT ANKLE - COMPLETE 3+ VIEW

[x ankle ap right]
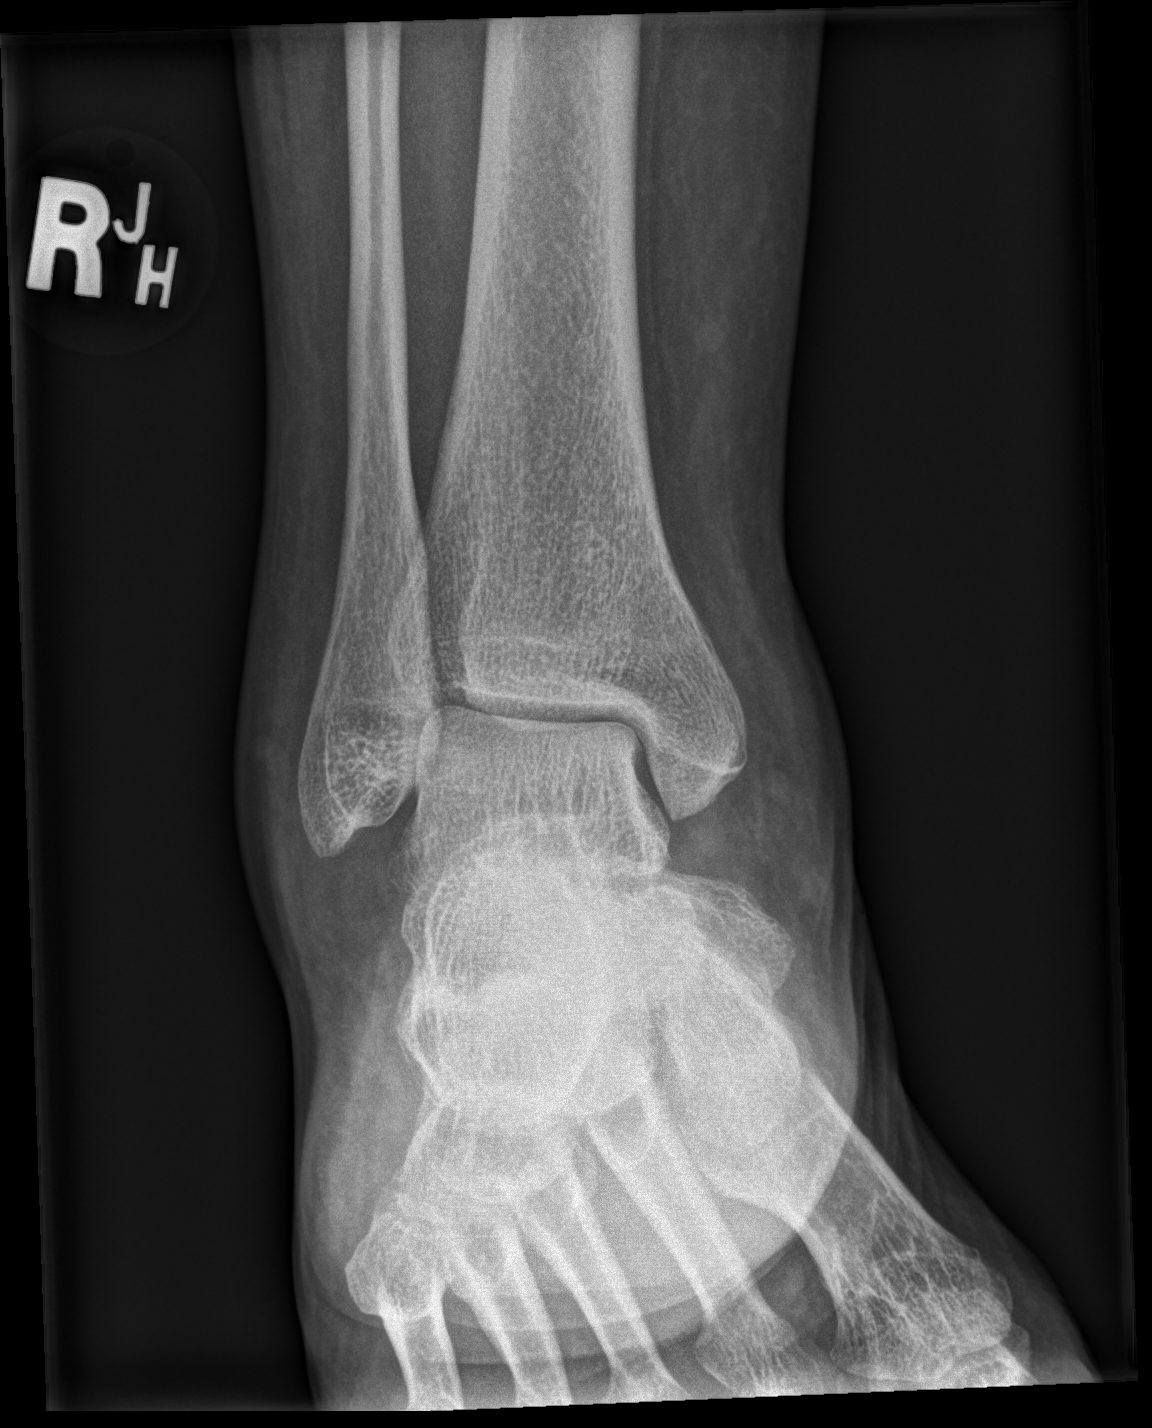

[x ankle obl right]
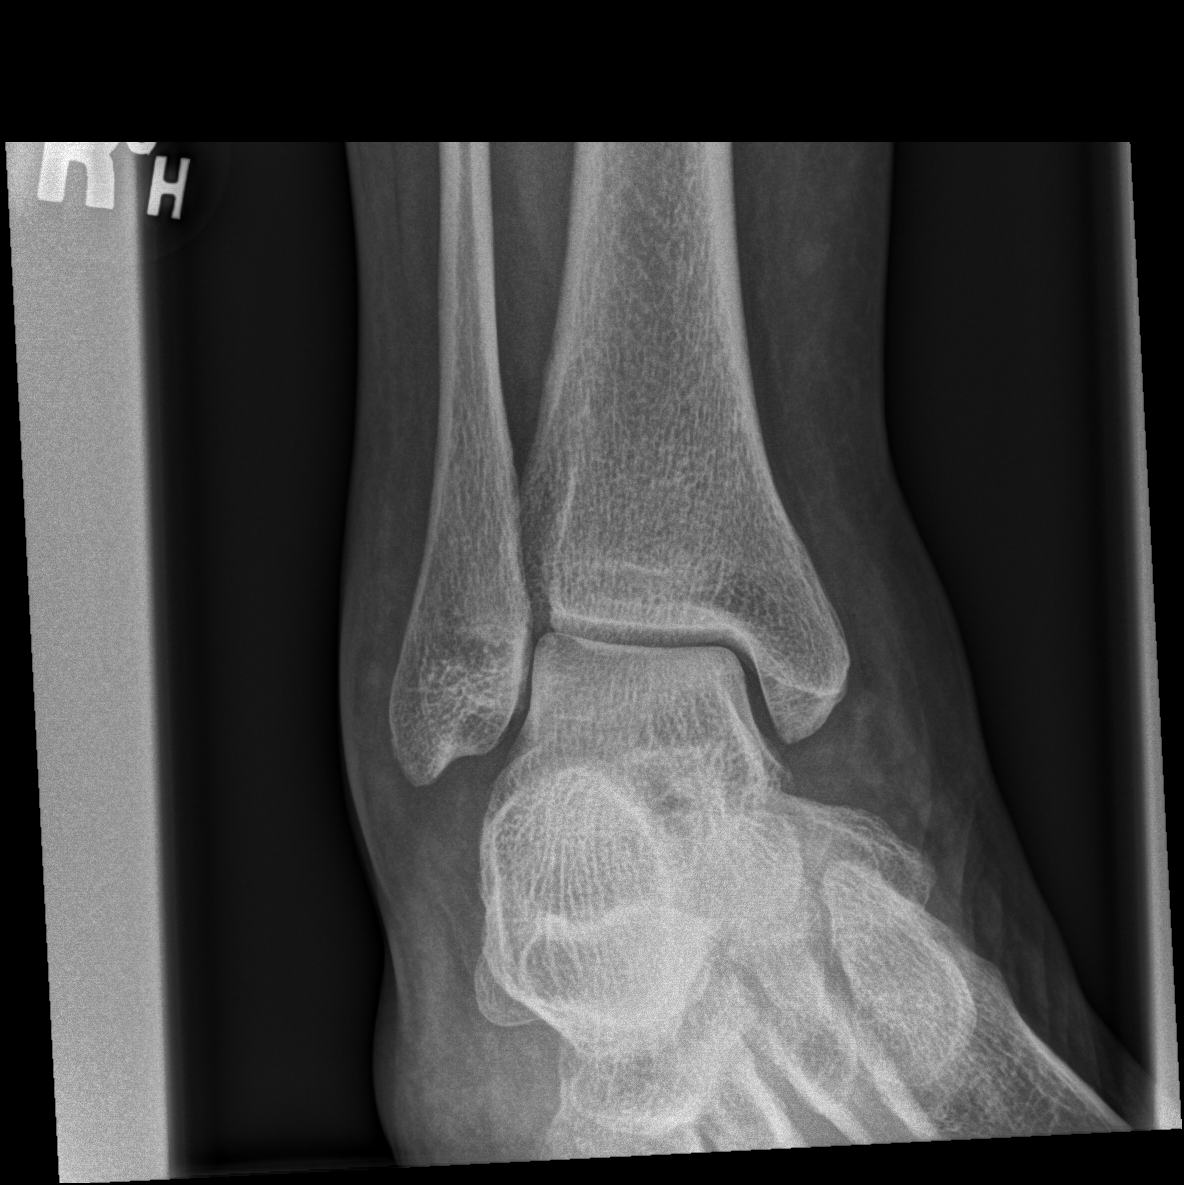

[x ankle lat right]
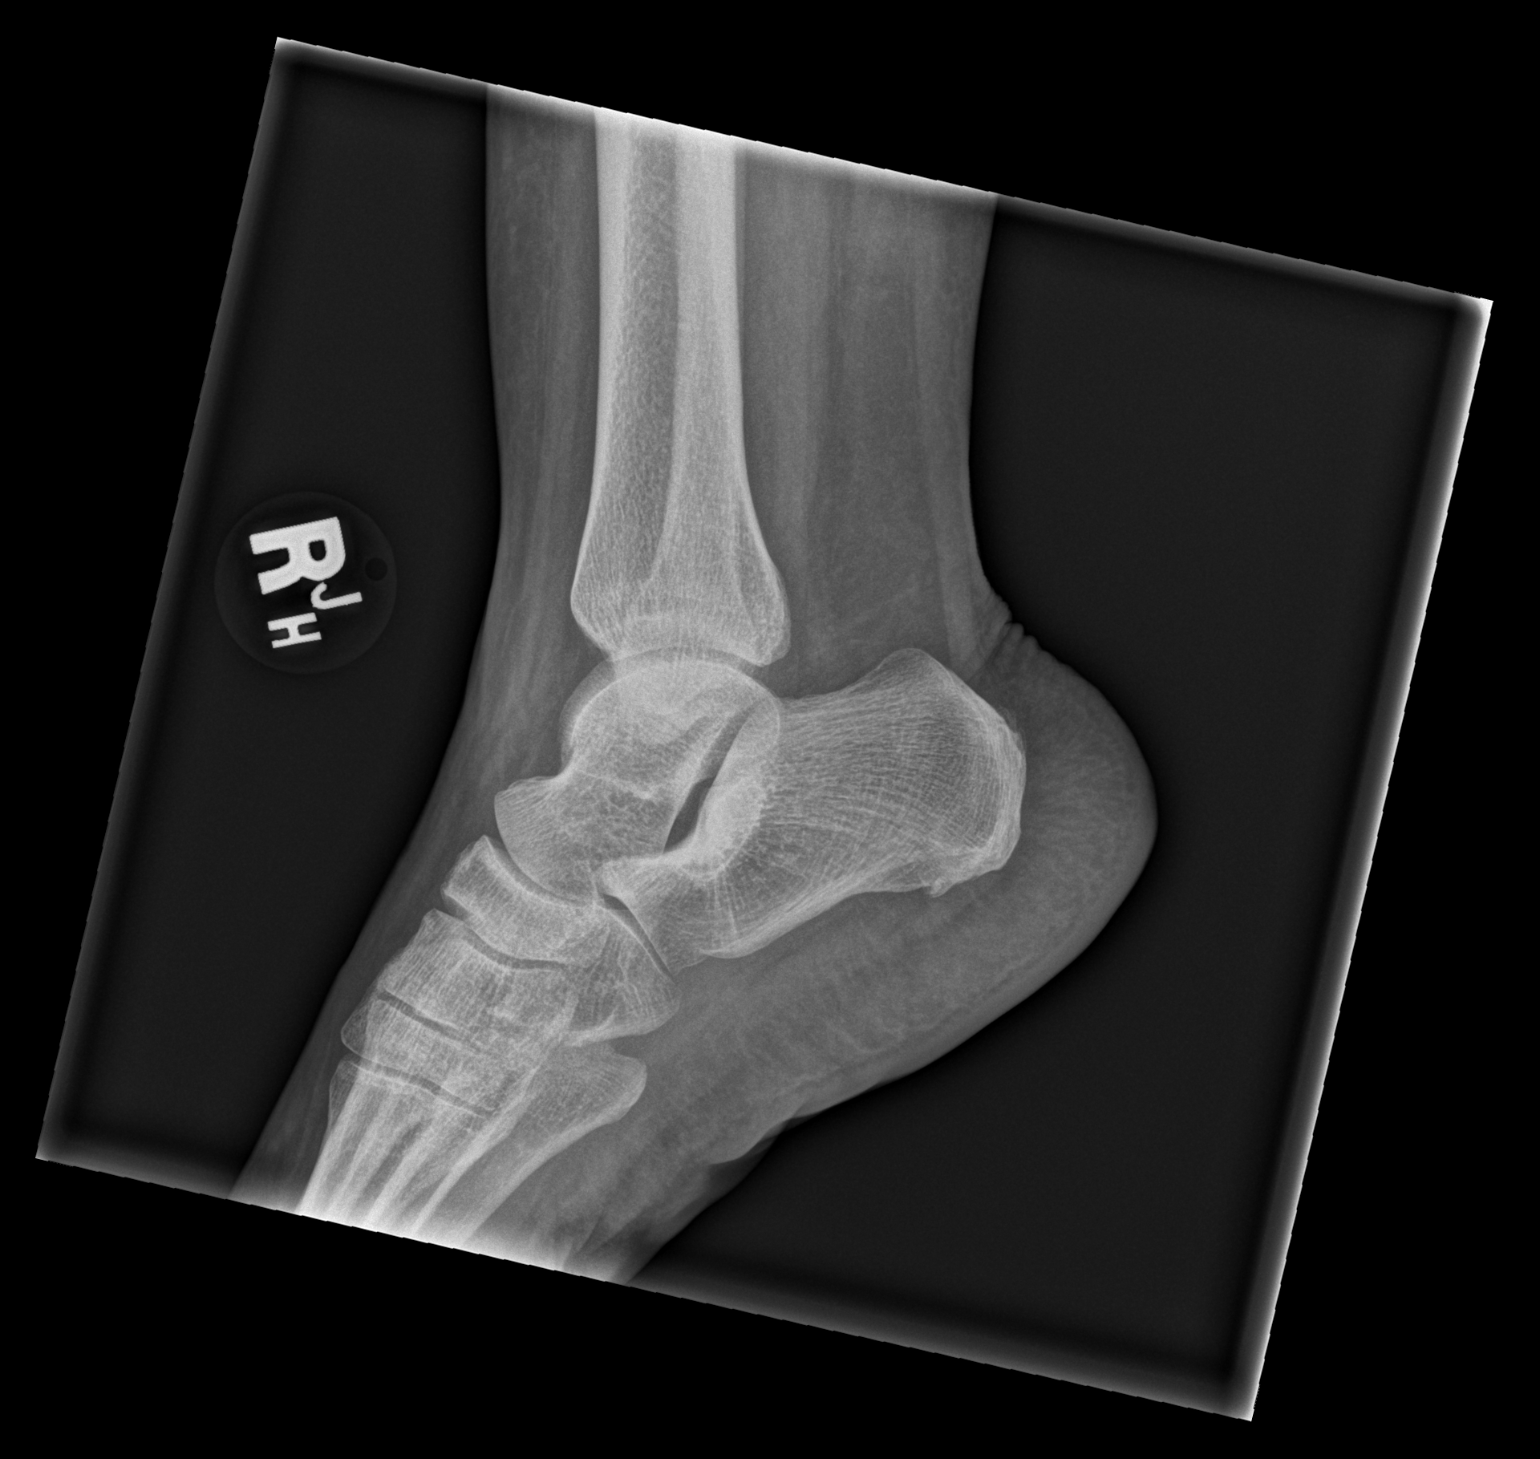

[3 of 3 positions shown; findings below may reference images not displayed]

FINDINGS: Soft tissue swelling is present medially and laterally. There is no
acute fracture or malalignment. Ankle mortise is symmetric. Small
plantar calcaneal spur.
IMPRESSION: Soft tissue swelling.  No acute osseous abnormality.

## 2017-05-26 ENCOUNTER — Telehealth: Payer: Self-pay | Admitting: Nurse Practitioner

## 2017-05-26 NOTE — Telephone Encounter (Signed)
I left a message asking the patient if she can come at 10:45 on 06/07/17 instead of 10:00 ([employer] insurance doesn't cover AWV to my knowledge). VDM (DD)

## 2017-06-03 ENCOUNTER — Encounter: Payer: PRIVATE HEALTH INSURANCE | Admitting: Nurse Practitioner

## 2017-06-03 ENCOUNTER — Ambulatory Visit: Payer: PRIVATE HEALTH INSURANCE

## 2017-06-07 ENCOUNTER — Ambulatory Visit: Payer: PRIVATE HEALTH INSURANCE

## 2017-06-07 ENCOUNTER — Encounter: Payer: PRIVATE HEALTH INSURANCE | Admitting: Nurse Practitioner

## 2017-06-14 ENCOUNTER — Ambulatory Visit (INDEPENDENT_AMBULATORY_CARE_PROVIDER_SITE_OTHER): Payer: PRIVATE HEALTH INSURANCE | Admitting: Nurse Practitioner

## 2017-06-14 ENCOUNTER — Encounter: Payer: Self-pay | Admitting: Nurse Practitioner

## 2017-06-14 VITALS — BP 116/72 | HR 60 | Temp 98.1°F | Resp 17 | Ht 62.0 in | Wt 221.0 lb

## 2017-06-14 DIAGNOSIS — E785 Hyperlipidemia, unspecified: Secondary | ICD-10-CM

## 2017-06-14 DIAGNOSIS — E6609 Other obesity due to excess calories: Secondary | ICD-10-CM

## 2017-06-14 DIAGNOSIS — Z6839 Body mass index (BMI) 39.0-39.9, adult: Secondary | ICD-10-CM | POA: Diagnosis not present

## 2017-06-14 DIAGNOSIS — Z0000 Encounter for general adult medical examination without abnormal findings: Secondary | ICD-10-CM

## 2017-06-14 LAB — COMPLETE METABOLIC PANEL WITH GFR
AG Ratio: 1.5 (calc) (ref 1.0–2.5)
ALBUMIN MSPROF: 4.1 g/dL (ref 3.6–5.1)
ALKALINE PHOSPHATASE (APISO): 69 U/L (ref 33–130)
ALT: 14 U/L (ref 6–29)
AST: 18 U/L (ref 10–35)
BILIRUBIN TOTAL: 0.7 mg/dL (ref 0.2–1.2)
BUN: 17 mg/dL (ref 7–25)
CHLORIDE: 107 mmol/L (ref 98–110)
CO2: 29 mmol/L (ref 20–32)
CREATININE: 0.87 mg/dL (ref 0.50–0.99)
Calcium: 9.3 mg/dL (ref 8.6–10.4)
GFR, Est African American: 81 mL/min/{1.73_m2} (ref >=60)
GFR, Est Non African American: 70 mL/min/{1.73_m2} (ref >=60)
GLUCOSE: 79 mg/dL (ref 65–99)
Globulin: 2.8 g/dL (calc) (ref 1.9–3.7)
Potassium: 4 mmol/L (ref 3.5–5.3)
Sodium: 142 mmol/L (ref 135–146)
Total Protein: 6.9 g/dL (ref 6.1–8.1)

## 2017-06-14 LAB — CBC WITH DIFFERENTIAL/PLATELET
BASOS PCT: 0.3 %
Basophils Absolute: 9 cells/uL (ref 0–200)
EOS PCT: 1.6 %
Eosinophils Absolute: 50 cells/uL (ref 15–500)
HEMATOCRIT: 38.5 % (ref 35.0–45.0)
Hemoglobin: 12.9 g/dL (ref 11.7–15.5)
LYMPHS ABS: 1516 {cells}/uL (ref 850–3900)
MCH: 29.9 pg (ref 27.0–33.0)
MCHC: 33.5 g/dL (ref 32.0–36.0)
MCV: 89.3 fL (ref 80.0–100.0)
MPV: 11.9 fL (ref 7.5–12.5)
Monocytes Relative: 6.8 %
NEUTROS ABS: 1314 {cells}/uL — AB (ref 1500–7800)
Neutrophils Relative %: 42.4 %
Platelets: 157 10*3/uL (ref 140–400)
RBC: 4.31 10*6/uL (ref 3.80–5.10)
RDW: 13 % (ref 11.0–15.0)
Total Lymphocyte: 48.9 %
WBC: 3.1 10*3/uL — AB (ref 3.8–10.8)
WBCMIX: 211 {cells}/uL (ref 200–950)

## 2017-06-14 LAB — LIPID PANEL
Cholesterol: 178 mg/dL (ref <200)
HDL: 69 mg/dL (ref >50)
LDL CHOLESTEROL (CALC): 91 mg/dL
NON-HDL CHOLESTEROL (CALC): 109 mg/dL (ref <130)
TRIGLYCERIDES: 85 mg/dL (ref <150)
Total CHOL/HDL Ratio: 2.6 (calc) (ref <5.0)

## 2017-06-14 LAB — TSH: TSH: 0.97 m[IU]/L (ref 0.40–4.50)

## 2017-06-14 MED ORDER — ATORVASTATIN CALCIUM 10 MG PO TABS: 5.0000 mg | ORAL_TABLET | Freq: Every day | ORAL | 1 refills | Status: DC

## 2017-06-14 NOTE — Progress Notes (Signed)
Provider: Lauree Chandler, NP  Patient Care Team: Lauree Chandler, NP as PCP - General (Nurse Practitioner)  Extended Emergency Contact Information Primary Emergency Contact: Bieler,Babatunde Address: [place], [place] [address] [place] Phone: [phone] Work Phone: [phone] Mobile Phone: [phone] Relation: Spouse Secondary Emergency Contact: Olofintyi,Emmanuel Address: [address]          [place], [place] [address] [place] Phone: [phone] Mobile Phone: [phone] Relation: Son Allergies  Allergen Reactions  . Pravastatin Other (See Comments)    Dizziness   Code Status: FULL Goals of Care: Advanced Directive information Advanced Directives 06/14/2017  Does Patient Have a Medical Advance Directive? No  Would patient like information on creating a medical advance directive? No - Patient declined     Chief Complaint  Patient presents with  . Medical Management of Chronic Issues    Pt is being seen for a complete physical for management of chronic conditions.   Marland Kitchen MMSE    29/30; passed clock drawing.    HPI: Patient is a 66 y.o. female seen in today for an annual wellness exam.   Major illnesses or hospitalization in the last year- none  Depression screen [facility] 2/9 06/14/2017 01/28/2016 01/03/2015  Decreased Interest 0 0 0  Down, Depressed, Hopeless 0 0 0  PHQ - 2 Score 0 0 0    Fall Risk  06/14/2017 11/23/2016 07/28/2016 03/24/2016 01/28/2016  Falls in the past year? No No No No No   MMSE - Mini Mental State Exam 06/14/2017  Orientation to time 4  Orientation to Place 5  Registration 3  Attention/ Calculation 5  Recall 3  Language- name 2 objects 2  Language- repeat 1  Language- follow 3 step command 3  Language- read & follow direction 1  Write a sentence 1  Copy design 1  Total score 29     Health Maintenance  Topic Date Due  . TETANUS/TDAP  08/10/2008  .  Hepatitis C Screening  02/07/2024 (Originally 01-05-1951)  . HIV Screening  02/07/2024 (Originally 07/18/1966)  . PNA vac Low Risk Adult (2 of 2 - PPSV23) 11/23/2017  . PAP SMEAR  01/28/2019  . MAMMOGRAM  04/08/2019  . COLONOSCOPY  02/08/2027  . INFLUENZA VACCINE  Completed  . DEXA SCAN  Completed    Functional Status Survey: Is the patient deaf or have difficulty hearing?: No Does the patient have difficulty seeing, even when wearing glasses/contacts?: No Does the patient have difficulty concentrating, remembering, or making decisions?: No Does the patient have difficulty walking or climbing stairs?: No Does the patient have difficulty dressing or bathing?: No Does the patient have difficulty doing errands alone such as visiting a doctor's office or shopping?: No     Diet?attempts heart healthy with lot of vegetables.  Walking every day 30 mins to 2 hours Vision Screening Comments: Pt will see her regular eye doctor in December 2018   Dentition: every 6 months  Pain: back pain, went to therapy from work otherwise none.  Had colonoscopy this year around July of this year found polyps, every 5 year follow up  Past Medical History:  Diagnosis Date  . Arthritis of knee   . Bradycardia   . Bronchitis   . Osteopenia 01/27/2017  . Vaginitis     Past Surgical History:  Procedure Laterality Date  . BREAST BIOPSY Right   . BREAST BIOPSY Right  Social History   Socioeconomic History  . Marital status: Married    Spouse name: None  . Number of children: None  . Years of education: None  . Highest education level: None  Social Needs  . Financial resource strain: None  . Food insecurity - worry: None  . Food insecurity - inability: None  . Transportation needs - medical: None  . Transportation needs - non-medical: None  Occupational History  . None  Tobacco Use  . Smoking status: Never Smoker  . Smokeless tobacco: Never Used  Substance and Sexual Activity  . Alcohol  use: No  . Drug use: No  . Sexual activity: Yes    Partners: Male    Birth control/protection: Post-menopausal  Other Topics Concern  . None  Social History Narrative   Diet: Regular      Do you drink/ eat things with caffeine?Yes ,In small ammounts      Marital status: Married                              What year were you married ? 1976      Do you live in a house, apartment,assistred living, condo, trailer, etc.)? House      Is it one or more stories? One story      How many persons live in your home ?  2 people      Do you have any pets in your home ?(please list) No      Current or past profession: Certified Nursing Assistant      Do you exercise?  Yes                            Type & how often: Walking ( every day)      Do you have a living will? No      Do you have a DNR form?  No                     If not, do you want to discuss one? Yes      Do you have signed POA?HPOA forms?  No               If so, please bring to your        appointment       History reviewed. No pertinent family history.  Review of Systems:  Review of Systems  Constitutional: Negative for activity change, appetite change, fatigue and unexpected weight change.  HENT: Negative for congestion, dental problem and facial swelling.   Eyes: Negative.   Respiratory: Negative for cough and shortness of breath.   Cardiovascular: Negative for chest pain, palpitations and leg swelling.  Gastrointestinal: Negative for abdominal pain, constipation and diarrhea.  Genitourinary: Negative for difficulty urinating and dysuria.  Musculoskeletal: Positive for back pain (improving. ). Negative for myalgias.  Skin: Negative for color change and wound.  Neurological: Negative for dizziness, weakness, light-headedness and headaches.  Psychiatric/Behavioral: Negative for agitation, behavioral problems and confusion.     Allergies as of 06/14/2017      Reactions   Pravastatin Other (See Comments)    Dizziness      Medication List        Accurate as of 06/14/17 11:27 AM. Always use your most recent med list.          atorvastatin 10 MG tablet Commonly  known as:  LIPITOR Take 0.5 tablets (5 mg total) by mouth daily. At [place]         Physical Exam: Vitals:   06/14/17 1121  BP: 116/72  Pulse: 60  Resp: 17  Temp: 98.1 F (36.7 C)  TempSrc: Oral  SpO2: 97%  Weight: 221 lb (100.2 kg)  Height: '5\' 2"'  (1.575 m)   Body mass index is 40.42 kg/m. Physical Exam  Constitutional: She is oriented to person, place, and time. She appears well-developed and well-nourished. No distress.  HENT:  Head: Normocephalic and atraumatic.  Right Ear: Hearing, tympanic membrane, external ear and ear canal normal. No drainage, swelling or tenderness.  Left Ear: Hearing, tympanic membrane, external ear and ear canal normal. No drainage, swelling or tenderness.  Mouth/Throat: Oropharynx is clear and moist. No oropharyngeal exudate.  Eyes: Conjunctivae are normal. Pupils are equal, round, and reactive to light.  Neck: Normal range of motion. Neck supple.  Cardiovascular: Normal rate, regular rhythm and normal heart sounds.  Pulmonary/Chest: Effort normal and breath sounds normal. She exhibits no mass.  Abdominal: Soft. Bowel sounds are normal.  Musculoskeletal: Normal range of motion. She exhibits no edema or tenderness.  Neurological: She is alert and oriented to person, place, and time.  Skin: Skin is warm and dry. She is not diaphoretic.  Psychiatric: She has a normal mood and affect. Her behavior is normal. Judgment and thought content normal.   Labs reviewed: Basic Metabolic Panel: Recent Labs    07/21/16 0833 11/23/16 1111  NA 142 141  K 3.9 4.0  CL 110 107  CO2 26 28  GLUCOSE 96 78  BUN 14 18  CREATININE 0.74 0.68  CALCIUM 8.9 9.4   Liver Function Tests: Recent Labs    07/21/16 0833 11/23/16 1111  AST 18 18  ALT 13 13  ALKPHOS 70 65  BILITOT 0.6 0.5  PROT 6.3 6.8    ALBUMIN 3.9 4.1   No results for input(s): LIPASE, AMYLASE in the last 8760 hours. No results for input(s): AMMONIA in the last 8760 hours. CBC: Recent Labs    11/23/16 1111  WBC 3.0*  NEUTROABS 1,170*  HGB 13.0  HCT 39.6  MCV 91.0  PLT 150   Lipid Panel: Recent Labs    07/21/16 0833 11/23/16 1111  CHOL 171 179  HDL 63 71  LDLCALC 92 95  TRIG 81 67  CHOLHDL 2.7 2.5   No results found for: HGBA1C  Procedures: No results found.  Assessment/Plan 1. Hyperlipidemia, unspecified hyperlipidemia type -cont heart healthy diet, will refer to nutrition for further education to help with this and weight loss - atorvastatin (LIPITOR) 10 MG tablet; Take 0.5 tablets (5 mg total) daily by mouth. At [place]: 90 tablet; Refill: 1 - Amb ref to Medical Nutrition Therapy-MNT - CMP with eGFR - CBC with Differential/Platelets - Lipid Panel  2. Class 2 obesity due to excess calories without serious comorbidity with body mass index (BMI) of 39.0 to 39.9 in adult -unable to lose weight, states she attempts to eat healthy and exercise. - Amb ref to Medical Nutrition Therapy-MNT - TSH  3. Wellness examination -The patient was counseled regarding the appropriate use of alcohol, regular self-examination of the breasts on a monthly basis, prevention of dental and periodontal disease, diet, regular sustained exercise for at least 30 minutes 5 times per week, routine screening interval for mammogram as recommended by the [place] and ACOG, importance of regular PAP smears,  tobacco use,  and recommended schedule for GI hemoccult testing, colonoscopy, cholesterol, thyroid and diabetes screening. - CMP with eGFR - CBC with Differential/Platelets - Lipid Panel  Next appt: 6 months sooner if needed Ladarrius Bogdanski K. Harle Battiest  [facility] [phone] 8 am - 5 pm) [phone] (after hours)

## 2017-06-14 NOTE — Patient Instructions (Signed)

## 2017-07-26 ENCOUNTER — Ambulatory Visit: Payer: PRIVATE HEALTH INSURANCE | Admitting: Dietician

## 2017-12-13 ENCOUNTER — Ambulatory Visit (INDEPENDENT_AMBULATORY_CARE_PROVIDER_SITE_OTHER): Payer: PRIVATE HEALTH INSURANCE | Admitting: Nurse Practitioner

## 2017-12-13 ENCOUNTER — Encounter: Payer: Self-pay | Admitting: Nurse Practitioner

## 2017-12-13 VITALS — BP 120/70 | HR 62 | Temp 97.8°F | Ht 62.0 in | Wt 214.0 lb

## 2017-12-13 DIAGNOSIS — Z23 Encounter for immunization: Secondary | ICD-10-CM | POA: Diagnosis not present

## 2017-12-13 DIAGNOSIS — Z6839 Body mass index (BMI) 39.0-39.9, adult: Secondary | ICD-10-CM | POA: Diagnosis not present

## 2017-12-13 DIAGNOSIS — E785 Hyperlipidemia, unspecified: Secondary | ICD-10-CM

## 2017-12-13 DIAGNOSIS — M545 Low back pain, unspecified: Secondary | ICD-10-CM

## 2017-12-13 DIAGNOSIS — E6609 Other obesity due to excess calories: Secondary | ICD-10-CM

## 2017-12-13 LAB — COMPREHENSIVE METABOLIC PANEL
AG Ratio: 1.5 (calc) (ref 1.0–2.5)
ALBUMIN MSPROF: 3.8 g/dL (ref 3.6–5.1)
ALKALINE PHOSPHATASE (APISO): 59 U/L (ref 33–130)
ALT: 13 U/L (ref 6–29)
AST: 18 U/L (ref 10–35)
BUN: 17 mg/dL (ref 7–25)
CO2: 28 mmol/L (ref 20–32)
CREATININE: 0.87 mg/dL (ref 0.50–0.99)
Calcium: 9 mg/dL (ref 8.6–10.4)
Chloride: 111 mmol/L — ABNORMAL HIGH (ref 98–110)
GLUCOSE: 88 mg/dL (ref 65–99)
Globulin: 2.6 g/dL (calc) (ref 1.9–3.7)
Potassium: 3.9 mmol/L (ref 3.5–5.3)
Sodium: 143 mmol/L (ref 135–146)
TOTAL PROTEIN: 6.4 g/dL (ref 6.1–8.1)
Total Bilirubin: 0.5 mg/dL (ref 0.2–1.2)

## 2017-12-13 LAB — LIPID PANEL
Cholesterol: 166 mg/dL (ref <200)
HDL: 55 mg/dL (ref >50)
LDL CHOLESTEROL (CALC): 92 mg/dL
Non-HDL Cholesterol (Calc): 111 mg/dL (calc) (ref <130)
TRIGLYCERIDES: 92 mg/dL (ref <150)
Total CHOL/HDL Ratio: 3 (calc) (ref <5.0)

## 2017-12-13 MED ORDER — TETANUS-DIPHTH-ACELL PERTUSSIS 5-2.5-18.5 LF-MCG/0.5 IM SUSP: 0.5000 mL | Freq: Once | INTRAMUSCULAR | 0 refills | Status: AC

## 2017-12-13 NOTE — Addendum Note (Signed)
Addended by: Denyse Amass on: 12/13/2017 10:55 AM   Modules accepted: Orders

## 2017-12-13 NOTE — Patient Instructions (Signed)
6 months for routine follow up 1 year to see Sherrie Mustache, NP  for physical

## 2017-12-13 NOTE — Progress Notes (Signed)
Careteam: Patient Care Team: Lauree Chandler, NP as PCP - General (Nurse Practitioner)  Advanced Directive information Does Patient Have a Medical Advance Directive?: No  Allergies  Allergen Reactions  . Pravastatin Other (See Comments)    Dizziness    Chief Complaint  Patient presents with  . Medical Management of Chronic Issues    Pt is being seen for a 6 month routine visit.   . ACP    needed     HPI: Patient is a 67 y.o. female seen in the office today for routine follow up. Hx of hyperlipidemia.  Fasting today.  Taking lipitor 10 mg by mouth for cholesterol.  Did not meet with nutritionist due to cost- insurance was not going to cover.  Working on diet modifications.  Weight is down from 221 lbs to 214 lb. Reports she had her grandchildren last week so weight went up some.  Very active. Walks a lot.   Low back pain after injury from work- had filed a Insurance underwriter comp claim and been worked up for this and had PT which was very beneficial. When she does a lot and on her feet it increased her pain then she will rest and do her exercises which helps.     Review of Systems:  Review of Systems  Constitutional: Negative for chills, fever and weight loss.  HENT: Negative for tinnitus.   Respiratory: Negative for cough, sputum production and shortness of breath.   Cardiovascular: Negative for chest pain, palpitations and leg swelling.  Gastrointestinal: Negative for abdominal pain, constipation, diarrhea and heartburn.  Genitourinary: Negative for dysuria, frequency and urgency.  Musculoskeletal: Negative for back pain, falls, joint pain and myalgias.  Skin: Negative.   Neurological: Negative for dizziness and headaches.  Psychiatric/Behavioral: Negative for depression and memory loss. The patient does not have insomnia.     Past Medical History:  Diagnosis Date  . Arthritis of knee   . Bradycardia   . Bronchitis   . Osteopenia 01/27/2017  . Vaginitis    Past  Surgical History:  Procedure Laterality Date  . BREAST BIOPSY Right   . BREAST BIOPSY Right    Social History:   reports that she has never smoked. She has never used smokeless tobacco. She reports that she does not drink alcohol or use drugs.  History reviewed. No pertinent family history.  Medications: Patient's Medications  New Prescriptions   No medications on file  Previous Medications   ATORVASTATIN (LIPITOR) 10 MG TABLET    Take 0.5 tablets (5 mg total) daily by mouth. At [place]  Modified Medications   No medications on file  Discontinued Medications   No medications on file     Physical Exam:  Vitals:   12/13/17 1020  BP: 120/70  Pulse: 62  Temp: 97.8 F (36.6 C)  TempSrc: Oral  SpO2: 97%  Weight: 214 lb (97.1 kg)  Height: 5\' 2"  (1.575 m)   Body mass index is 39.14 kg/m.  Physical Exam  Constitutional: She is oriented to person, place, and time. She appears well-developed and well-nourished. No distress.  HENT:  Head: Normocephalic and atraumatic.  Mouth/Throat: Oropharynx is clear and moist. No oropharyngeal exudate.  Eyes: Pupils are equal, round, and reactive to light. Conjunctivae are normal.  Neck: Normal range of motion. Neck supple.  Cardiovascular: Normal rate, regular rhythm and normal heart sounds.  Pulmonary/Chest: Effort normal and breath sounds normal.  Abdominal: Soft. Bowel sounds are normal.  Musculoskeletal: She exhibits no edema  or tenderness.  Neurological: She is alert and oriented to person, place, and time.  Skin: Skin is warm and dry. She is not diaphoretic.  Psychiatric: She has a normal mood and affect.    Labs reviewed: Basic Metabolic Panel: Recent Labs    06/14/17 1148  NA 142  K 4.0  CL 107  CO2 29  GLUCOSE 79  BUN 17  CREATININE 0.87  CALCIUM 9.3  TSH 0.97   Liver Function Tests: Recent Labs    06/14/17 1148  AST 18  ALT 14  BILITOT 0.7  PROT 6.9   No results for input(s): LIPASE, AMYLASE in the last  8760 hours. No results for input(s): AMMONIA in the last 8760 hours. CBC: Recent Labs    06/14/17 1148  WBC 3.1*  NEUTROABS 1,314*  HGB 12.9  HCT 38.5  MCV 89.3  PLT 157   Lipid Panel: Recent Labs    06/14/17 1148  CHOL 178  HDL 69  LDLCALC 91  TRIG 85  CHOLHDL 2.6   TSH: Recent Labs    06/14/17 1148  TSH 0.97   A1C: No results found for: HGBA1C   Assessment/Plan 1. Hyperlipidemia, unspecified hyperlipidemia type -LDL at goal on lipitor 10 mg daily, will follow up lab today. Continue medication with diet.  - Lipid Panel - CMP  2. Class 2 obesity due to excess calories without serious comorbidity with body mass index (BMI) of 39.0 to 39.9 in adult Continues to work on weight loss with healthy diet and active lifestyle.   3. Lumbar pain Ongoing after injury, manages with lifestyle modifications and exercise   Next appt: 6 months with Dr Eulas Post, 1 year with myself for physical.  Carlos American. [place] [phone]

## 2017-12-13 NOTE — ACP (Advance Care Planning) (Signed)
Does not have advance directives. Paperwork given in office today and discussed. Plans to discuss with family.

## 2018-03-04 ENCOUNTER — Other Ambulatory Visit: Payer: Self-pay | Admitting: Nurse Practitioner

## 2018-03-04 DIAGNOSIS — Z1231 Encounter for screening mammogram for malignant neoplasm of breast: Secondary | ICD-10-CM

## 2018-04-08 ENCOUNTER — Ambulatory Visit: Payer: PRIVATE HEALTH INSURANCE

## 2018-04-13 ENCOUNTER — Ambulatory Visit
Admission: RE | Admit: 2018-04-13 | Discharge: 2018-04-13 | Disposition: A | Discharge status: Home / Self Care | Payer: PRIVATE HEALTH INSURANCE | Source: Ambulatory Visit | Attending: Nurse Practitioner | Admitting: Nurse Practitioner

## 2018-04-13 DIAGNOSIS — Z1231 Encounter for screening mammogram for malignant neoplasm of breast: Secondary | ICD-10-CM

## 2018-04-28 ENCOUNTER — Encounter: Payer: Self-pay | Admitting: Family

## 2018-04-28 ENCOUNTER — Ambulatory Visit (INDEPENDENT_AMBULATORY_CARE_PROVIDER_SITE_OTHER): Payer: PRIVATE HEALTH INSURANCE | Admitting: Family

## 2018-04-28 VITALS — BP 120/70 | HR 51 | Temp 97.6°F | Ht 62.0 in | Wt 216.0 lb

## 2018-04-28 DIAGNOSIS — K146 Glossodynia: Secondary | ICD-10-CM | POA: Diagnosis not present

## 2018-04-28 NOTE — Progress Notes (Signed)
Provider: Dinah Ngetich FNP-C  Lauree Chandler, NP  Patient Care Team: Lauree Chandler, NP as PCP - General (Nurse Practitioner)  Extended Emergency Contact Information Primary Emergency Contact: Eastland,Babatunde Address: [address]          [place], [place] [address] [place] Phone: [phone] Work Phone: [phone] Mobile Phone: [phone] Relation: Spouse Secondary Emergency Contact: Olofintyi,Emmanuel Address: [address]          [place], [place] [address] [place] Phone: [phone] Mobile Phone: [phone] Relation: Son   Goals of care: Advanced Directive information Advanced Directives 12/13/2017  Does Patient Have a Medical Advance Directive? No  Would patient like information on creating a medical advance directive? -     Chief Complaint  Patient presents with  . Acute Visit    Patient c/o throat pain and foul taste in mouth x 1 month     HPI:  Pt is a 67 y.o. female seen today at [facility] for an acute visit for evaluation of burning and tingling sensation of the tongue and lips  X 1 month.she started as  throat pain/cough described as burning.Her daughter who is a doctor recommended mucinex that she took without relief.she had dental deep cleaning done.Not sure whether tongue burning sensation started after dental work.she denies any fever,chills,cough,N/V or heart burn.    Past Medical History:  Diagnosis Date  . Arthritis of knee   . Bradycardia   . Bronchitis   . Osteopenia 01/27/2017  . Vaginitis    Past Surgical History:  Procedure Laterality Date  . BREAST BIOPSY Right   . BREAST BIOPSY Right     Allergies  Allergen Reactions  . Pravastatin Other (See Comments)    Dizziness    Outpatient Encounter Medications as of 04/28/2018  Medication Sig  . atorvastatin (LIPITOR) 10 MG tablet Take 5 mg by mouth daily.  . [DISCONTINUED] atorvastatin (LIPITOR) 10 MG tablet  Take 0.5 tablets (5 mg total) daily by mouth. At [facility]   No facility-administered encounter medications on file as of 04/28/2018.     Review of Systems  Constitutional: Negative for appetite change, chills, fatigue and fever.  HENT: Negative for congestion, rhinorrhea, sinus pressure, sinus pain, sneezing, sore throat and trouble swallowing.   Eyes: Negative for discharge, redness, itching and visual disturbance.  Respiratory: Negative for cough, chest tightness, shortness of breath and wheezing.   Cardiovascular: Negative for chest pain, palpitations and leg swelling.  Gastrointestinal: Negative for abdominal distention, abdominal pain, constipation, nausea and vomiting.  Skin: Negative for color change, pallor and rash.  Neurological: Negative for dizziness, light-headedness and headaches.    Immunization History  Administered Date(s) Administered  . Influenza, High Dose Seasonal PF 06/11/2017  . Influenza-Unspecified 05/15/2016  . Pneumococcal Conjugate-13 11/23/2016  . Pneumococcal Polysaccharide-23 12/13/2017  . Td 08/10/1998  . Zoster 01/09/2016   Pertinent  Health Maintenance Due  Topic Date Due  . INFLUENZA VACCINE  03/10/2018  . MAMMOGRAM  04/13/2020  . COLONOSCOPY  02/08/2027  . DEXA SCAN  Completed  . PNA vac Low Risk Adult  Completed   Fall Risk  04/28/2018 12/13/2017 06/14/2017 11/23/2016 07/28/2016  Falls in the past year? No No No No No    Vitals:   04/28/18 1054  BP: 120/70  Pulse: (!) 51  Temp: 97.6 F (36.4 C)  TempSrc: Oral  SpO2: 96%  Weight: 216 lb (98 kg)  Height: 5\' 2"  (1.575 m)   Body mass  index is 39.51 kg/m. Physical Exam  Constitutional: She is oriented to person, place, and time. She appears well-developed and well-nourished. No distress.  HENT:  Head: Normocephalic.  Right Ear: External ear normal.  Left Ear: External ear normal.  Nose: Nose normal.  Mouth/Throat: Oropharynx is clear and moist. No oropharyngeal exudate.  Eyes: Pupils  are equal, round, and reactive to light. Conjunctivae and EOM are normal. Right eye exhibits no discharge. Left eye exhibits no discharge. No scleral icterus.  Neck: Normal range of motion. No JVD present. No thyromegaly present.  Cardiovascular: Normal rate, regular rhythm, normal heart sounds and intact distal pulses. Exam reveals no gallop and no friction rub.  No murmur heard. Pulmonary/Chest: Effort normal and breath sounds normal. No respiratory distress. She has no wheezes. She has no rales.  Abdominal: Soft. Bowel sounds are normal. She exhibits no distension and no mass. There is no tenderness. There is no rebound and no guarding. No hernia.  Lymphadenopathy:    She has no cervical adenopathy.  Neurological: She is oriented to person, place, and time.  Skin: Skin is warm and dry. No rash noted. No erythema. No pallor.  Psychiatric: She has a normal mood and affect. Her behavior is normal. Judgment and thought content normal.  Vitals reviewed.  Labs reviewed: Recent Labs    06/14/17 1148 12/13/17 1044  NA 142 143  K 4.0 3.9  CL 107 111*  CO2 29 28  GLUCOSE 79 88  BUN 17 17  CREATININE 0.87 0.87  CALCIUM 9.3 9.0   Recent Labs    06/14/17 1148 12/13/17 1044  AST 18 18  ALT 14 13  BILITOT 0.7 0.5  PROT 6.9 6.4   Recent Labs    06/14/17 1148  WBC 3.1*  NEUTROABS 1,314*  HGB 12.9  HCT 38.5  MCV 89.3  PLT 157   Lab Results  Component Value Date   TSH 0.97 06/14/2017   No results found for: HGBA1C Lab Results  Component Value Date   CHOL 166 12/13/2017   HDL 55 12/13/2017   LDLCALC 92 12/13/2017   TRIG 92 12/13/2017   CHOLHDL 3.0 12/13/2017    Significant Diagnostic Results in last 30 days:  Mm Digital Screening Bilateral  Result Date: 04/13/2018 CLINICAL DATA:  Screening. EXAM: DIGITAL SCREENING BILATERAL MAMMOGRAM WITH CAD COMPARISON:  Previous exam(s). ACR Breast Density Category b: There are scattered areas of fibroglandular density. FINDINGS: There  are no findings suspicious for malignancy. Images were processed with CAD. IMPRESSION: No mammographic evidence of malignancy. A result letter of this screening mammogram will be mailed directly to the patient. RECOMMENDATION: Screening mammogram in one year. (Code:[id number]-B-01Y) BI-RADS CATEGORY  1: Negative. Electronically Signed   By: Abelardo Diesel M.D.   On: 04/13/2018 12:50    Assessment/Plan  Tongue burning sensation Afebrile.Negative oropharynx exam clear.No ulceration or thrush noted.Will rule out Vit B12 deficiency.might benefit from magic mouth if symptoms not resolved. - Vitamin B12 level   Family/ staff Communication: Reviewed plan of care with patient   Labs/tests ordered: Vitamin B12 level    Dinah C Ngetich, NP

## 2018-04-28 NOTE — Patient Instructions (Signed)
1. Will call you with Vitamin B12 lab results 2. Notify provider if burning sensation on the tongue worsen or running any fever or heart burn symptoms.

## 2018-04-29 LAB — VITAMIN B12: VITAMIN B 12: 685 pg/mL (ref 200–1100)

## 2018-06-15 ENCOUNTER — Encounter: Payer: Self-pay | Admitting: Nurse Practitioner

## 2018-06-15 ENCOUNTER — Ambulatory Visit (INDEPENDENT_AMBULATORY_CARE_PROVIDER_SITE_OTHER): Payer: PRIVATE HEALTH INSURANCE | Admitting: Nurse Practitioner

## 2018-06-15 VITALS — BP 118/74 | HR 61 | Temp 97.6°F | Ht 62.0 in | Wt 215.0 lb

## 2018-06-15 DIAGNOSIS — Z6838 Body mass index (BMI) 38.0-38.9, adult: Secondary | ICD-10-CM

## 2018-06-15 DIAGNOSIS — E785 Hyperlipidemia, unspecified: Secondary | ICD-10-CM | POA: Diagnosis not present

## 2018-06-15 DIAGNOSIS — L600 Ingrowing nail: Secondary | ICD-10-CM

## 2018-06-15 DIAGNOSIS — M545 Low back pain, unspecified: Secondary | ICD-10-CM

## 2018-06-15 NOTE — Progress Notes (Signed)
Careteam: Patient Care Team: Jessica Chandler, NP as PCP - General (Nurse Practitioner)  Advanced Directive information Does Patient Have a Medical Advance Directive?: No  Allergies  Allergen Reactions  . Pravastatin Other (See Comments)    Dizziness    Chief Complaint  Patient presents with  . Medical Management of Chronic Issues    Pt is being seen for a 6 month routine visit. Pt has no conerns today.   . Audit C Screening    score of 0     HPI: Patient is a 67 y.o. female seen in the office today for routine follow up.  Was seen for burning in mouth acutely in September, b12 was WNL and sensation went away on its own.  Has been working on exercise and diet modifications.   Got flu vaccine at her job on Monday   Review of Systems:  Review of Systems  Constitutional: Negative for chills, fever and weight loss.  HENT: Negative for tinnitus.   Respiratory: Negative for cough, sputum production and shortness of breath.   Cardiovascular: Negative for chest pain, palpitations and leg swelling.  Gastrointestinal: Negative for abdominal pain, constipation, diarrhea and heartburn.  Genitourinary: Negative for dysuria, frequency and urgency.  Musculoskeletal: Positive for back pain. Negative for falls, joint pain and myalgias.       Accident on the job with ongoing back pain, stable  Skin: Negative.   Neurological: Negative for dizziness and headaches.  Psychiatric/Behavioral: Negative for depression and memory loss. The patient does not have insomnia.     Past Medical History:  Diagnosis Date  . Arthritis of knee   . Bradycardia   . Bronchitis   . Osteopenia 01/27/2017  . Vaginitis    Past Surgical History:  Procedure Laterality Date  . BREAST BIOPSY Right   . BREAST BIOPSY Right    Social History:   reports that she has never smoked. She has never used smokeless tobacco. She reports that she does not drink alcohol or use drugs.  History reviewed. No  pertinent family history.  Medications: Patient's Medications  New Prescriptions   No medications on file  Previous Medications   ATORVASTATIN (LIPITOR) 10 MG TABLET    Take 5 mg by mouth daily.  Modified Medications   No medications on file  Discontinued Medications   No medications on file     Physical Exam:  Vitals:   06/15/18 1036  BP: 118/74  Pulse: 61  Temp: 97.6 F (36.4 C)  TempSrc: Oral  SpO2: 96%  Weight: 215 lb (97.5 kg)  Height: 5\' 2"  (1.575 m)   Body mass index is 39.32 kg/m.  Physical Exam  Constitutional: She is oriented to person, place, and time. She appears well-developed and well-nourished. No distress.  HENT:  Head: Normocephalic and atraumatic.  Mouth/Throat: Oropharynx is clear and moist. No oropharyngeal exudate.  Eyes: Pupils are equal, round, and reactive to light. Conjunctivae are normal.  Neck: Normal range of motion. Neck supple.  Cardiovascular: Normal rate, regular rhythm and normal heart sounds.  Pulmonary/Chest: Effort normal and breath sounds normal.  Abdominal: Soft. Bowel sounds are normal.  Musculoskeletal: She exhibits no edema or tenderness.  Neurological: She is alert and oriented to person, place, and time.  Skin: Skin is warm and dry. She is not diaphoretic.  Psychiatric: She has a normal mood and affect.    Labs reviewed: Basic Metabolic Panel: Recent Labs    12/13/17 1044  NA 143  K 3.9  CL 111*  CO2 28  GLUCOSE 88  BUN 17  CREATININE 0.87  CALCIUM 9.0   Liver Function Tests: Recent Labs    12/13/17 1044  AST 18  ALT 13  BILITOT 0.5  PROT 6.4   No results for input(s): LIPASE, AMYLASE in the last 8760 hours. No results for input(s): AMMONIA in the last 8760 hours. CBC: No results for input(s): WBC, NEUTROABS, HGB, HCT, MCV, PLT in the last 8760 hours. Lipid Panel: Recent Labs    12/13/17 1044  CHOL 166  HDL 55  LDLCALC 92  TRIG 92  CHOLHDL 3.0   TSH: No results for input(s): TSH in the last  8760 hours. A1C: No results found for: HGBA1C   Assessment/Plan 1. Body mass index (BMI) of 38.0-38.9 in adult Noted today.   2. Class 2 severe obesity due to excess calories with serious comorbidity and body mass index (BMI) of 38.0 to 38.9 in adult Jessica Combs) encouraged ongoing weight loss with lifestyle modifications.  3. Hyperlipidemia, unspecified hyperlipidemia type -working on dietary modification with increase in activity -continues on statin - Lipid Panel - COMPLETE METABOLIC PANEL WITH GFR  4. Lumbar pain -from accident at work overall improved but occasional flare -uses tylenol occasionally PRN  - CBC with Differential/Platelets  5. Ingrown fingernail Improved, epsom salt soaks 2 -3 times daily until resolved, to notify if worsening of pain, swelling or redness.   Next appt: 6 months with Jessica Combs, [facility] [phone]

## 2018-06-15 NOTE — Patient Instructions (Signed)
Fat and Cholesterol Restricted Diet Getting too much fat and cholesterol in your diet may cause health problems. Following this diet helps keep your fat and cholesterol at normal levels. This can keep you from getting sick. What types of fat should I choose?  Choose monosaturated and polyunsaturated fats. These are found in foods such as olive oil, canola oil, flaxseeds, walnuts, almonds, and seeds.  Eat more omega-3 fats. Good choices include salmon, mackerel, sardines, tuna, flaxseed oil, and ground flaxseeds.  Limit saturated fats. These are in animal products such as meats, butter, and cream. They can also be in plant products such as palm oil, palm kernel oil, and coconut oil.  Avoid foods with partially hydrogenated oils in them. These contain trans fats. Examples of foods that have trans fats are stick margarine, some tub margarines, cookies, crackers, and other baked goods. What general guidelines do I need to follow?  Check food labels. Look for the words "trans fat" and "saturated fat."  When preparing a meal: ? Fill half of your plate with vegetables and green salads. ? Fill one fourth of your plate with whole grains. Look for the word "whole" as the first word in the ingredient list. ? Fill one fourth of your plate with lean protein foods.  Eat more foods that have fiber, like apples, carrots, beans, peas, and barley.  Eat more home-cooked foods. Eat less at restaurants and buffets.  Limit or avoid alcohol.  Limit foods high in starch and sugar.  Limit fried foods.  Cook foods without frying them. Baking, boiling, grilling, and broiling are all great options.  Lose weight if you are overweight. Losing even a small amount of weight can help your overall health. It can also help prevent diseases such as diabetes and heart disease. What foods can I eat? Grains Whole grains, such as whole wheat or whole grain breads, crackers, cereals, and pasta. Unsweetened oatmeal,  bulgur, barley, quinoa, or brown rice. Corn or whole wheat flour tortillas. Vegetables Fresh or frozen vegetables (raw, steamed, roasted, or grilled). Green salads. Fruits All fresh, canned (in natural juice), or frozen fruits. Meat and Other Protein Products Ground beef (85% or leaner), grass-fed beef, or beef trimmed of fat. Skinless chicken or turkey. Ground chicken or turkey. Pork trimmed of fat. All fish and seafood. Eggs. Dried beans, peas, or lentils. Unsalted nuts or seeds. Unsalted canned or dry beans. Dairy Low-fat dairy products, such as skim or 1% milk, 2% or reduced-fat cheeses, low-fat ricotta or cottage cheese, or plain low-fat yogurt. Fats and Oils Tub margarines without trans fats. Light or reduced-fat mayonnaise and salad dressings. Avocado. Olive, canola, sesame, or safflower oils. Natural peanut or almond butter (choose ones without added sugar and oil). The items listed above may not be a complete list of recommended foods or beverages. Contact your dietitian for more options. What foods are not recommended? Grains White bread. White pasta. White rice. Cornbread. Bagels, pastries, and croissants. Crackers that contain trans fat. Vegetables White potatoes. Corn. Creamed or fried vegetables. Vegetables in a cheese sauce. Fruits Dried fruits. Canned fruit in light or heavy syrup. Fruit juice. Meat and Other Protein Products Fatty cuts of meat. Ribs, chicken wings, bacon, sausage, bologna, salami, chitterlings, fatback, hot dogs, bratwurst, and packaged luncheon meats. Liver and organ meats. Dairy Whole or 2% milk, cream, half-and-half, and cream cheese. Whole milk cheeses. Whole-fat or sweetened yogurt. Full-fat cheeses. Nondairy creamers and whipped toppings. Processed cheese, cheese spreads, or cheese curds. Sweets and Desserts Corn   syrup, sugars, honey, and molasses. Candy. Jam and jelly. Syrup. Sweetened cereals. Cookies, pies, cakes, donuts, muffins, and ice  cream. Fats and Oils Butter, stick margarine, lard, shortening, ghee, or bacon fat. Coconut, palm kernel, or palm oils. Beverages Alcohol. Sweetened drinks (such as sodas, lemonade, and fruit drinks or punches). The items listed above may not be a complete list of foods and beverages to avoid. Contact your dietitian for more information. This information is not intended to replace advice given to you by your health care provider. Make sure you discuss any questions you have with your health care provider. Document Released: 01/26/2012 Document Revised: 04/02/2016 Document Reviewed: 10/26/2013 Elsevier Interactive Patient Education  2018 Elsevier Inc.  

## 2018-06-16 LAB — CBC WITH DIFFERENTIAL/PLATELET
BASOS ABS: 19 {cells}/uL (ref 0–200)
Basophils Relative: 0.7 %
Eosinophils Absolute: 119 cells/uL (ref 15–500)
Eosinophils Relative: 4.4 %
HCT: 37.5 % (ref 35.0–45.0)
HEMOGLOBIN: 12.5 g/dL (ref 11.7–15.5)
LYMPHS ABS: 1426 {cells}/uL (ref 850–3900)
MCH: 29.8 pg (ref 27.0–33.0)
MCHC: 33.3 g/dL (ref 32.0–36.0)
MCV: 89.5 fL (ref 80.0–100.0)
MPV: 12.2 fL (ref 7.5–12.5)
Monocytes Relative: 9.6 %
Neutro Abs: 878 cells/uL — ABNORMAL LOW (ref 1500–7800)
Neutrophils Relative %: 32.5 %
PLATELETS: 145 10*3/uL (ref 140–400)
RBC: 4.19 10*6/uL (ref 3.80–5.10)
RDW: 13.2 % (ref 11.0–15.0)
Total Lymphocyte: 52.8 %
WBC: 2.7 10*3/uL — ABNORMAL LOW (ref 3.8–10.8)
WBCMIX: 259 {cells}/uL (ref 200–950)

## 2018-06-16 LAB — COMPLETE METABOLIC PANEL WITH GFR
AG Ratio: 1.5 (calc) (ref 1.0–2.5)
ALBUMIN MSPROF: 3.9 g/dL (ref 3.6–5.1)
ALT: 15 U/L (ref 6–29)
AST: 20 U/L (ref 10–35)
Alkaline phosphatase (APISO): 75 U/L (ref 33–130)
BUN: 15 mg/dL (ref 7–25)
CALCIUM: 8.9 mg/dL (ref 8.6–10.4)
CO2: 28 mmol/L (ref 20–32)
CREATININE: 0.75 mg/dL (ref 0.50–0.99)
Chloride: 109 mmol/L (ref 98–110)
GFR, EST NON AFRICAN AMERICAN: 83 mL/min/{1.73_m2} (ref >=60)
GFR, Est African American: 96 mL/min/{1.73_m2} (ref >=60)
GLOBULIN: 2.6 g/dL (ref 1.9–3.7)
GLUCOSE: 93 mg/dL (ref 65–99)
Potassium: 4 mmol/L (ref 3.5–5.3)
SODIUM: 144 mmol/L (ref 135–146)
Total Bilirubin: 0.5 mg/dL (ref 0.2–1.2)
Total Protein: 6.5 g/dL (ref 6.1–8.1)

## 2018-06-16 LAB — LIPID PANEL
CHOLESTEROL: 181 mg/dL (ref <200)
HDL: 59 mg/dL (ref >50)
LDL Cholesterol (Calc): 105 mg/dL (calc) — ABNORMAL HIGH
Non-HDL Cholesterol (Calc): 122 mg/dL (calc) (ref <130)
Total CHOL/HDL Ratio: 3.1 (calc) (ref <5.0)
Triglycerides: 76 mg/dL (ref <150)

## 2018-09-09 ENCOUNTER — Other Ambulatory Visit: Payer: Self-pay | Admitting: Nurse Practitioner

## 2018-10-05 ENCOUNTER — Encounter (HOSPITAL_COMMUNITY): Payer: Self-pay

## 2018-10-05 ENCOUNTER — Other Ambulatory Visit: Payer: Self-pay

## 2018-10-05 ENCOUNTER — Ambulatory Visit (HOSPITAL_COMMUNITY)
Admission: EM | Admit: 2018-10-05 | Discharge: 2018-10-05 | Disposition: A | Discharge status: Home / Self Care | Payer: PRIVATE HEALTH INSURANCE | Attending: Family Medicine | Admitting: Family Medicine

## 2018-10-05 DIAGNOSIS — R69 Illness, unspecified: Secondary | ICD-10-CM | POA: Diagnosis not present

## 2018-10-05 DIAGNOSIS — J111 Influenza due to unidentified influenza virus with other respiratory manifestations: Secondary | ICD-10-CM

## 2018-10-05 MED ORDER — OSELTAMIVIR PHOSPHATE 75 MG PO CAPS: 75.0000 mg | ORAL_CAPSULE | Freq: Two times a day (BID) | ORAL | 0 refills | Status: AC

## 2018-10-05 MED ORDER — BENZONATATE 100 MG PO CAPS: ORAL_CAPSULE | ORAL | 0 refills | Status: DC

## 2018-10-05 MED ORDER — ACETAMINOPHEN 325 MG PO TABS
650.0000 mg | ORAL_TABLET | Freq: Once | ORAL | Status: AC
  Administered 2018-10-05: 650 mg via ORAL

## 2018-10-05 NOTE — Discharge Instructions (Addendum)

## 2018-10-05 NOTE — ED Triage Notes (Signed)
Pt cc cough, back pain, body aches, fever and chills.this started yesterday.

## 2018-10-05 NOTE — ED Provider Notes (Signed)
[facility]   [id number] 10/05/18 Arrival Time: 9381  ASSESSMENT & PLAN:  1. Influenza-like illness    See AVS for discharge instructions.  Meds ordered this encounter  Medications  . acetaminophen (TYLENOL) tablet 650 mg  . benzonatate (TESSALON) 100 MG capsule    Sig: Take 1 capsule by mouth every 8 (eight) hours for cough.    Dispense:  21 capsule    Refill:  0  . oseltamivir (TAMIFLU) 75 MG capsule    Sig: Take 1 capsule (75 mg total) by mouth 2 (two) times daily for 5 days.    Dispense:  10 capsule    Refill:  0   Work note provided. Discussed typical duration of symptoms. OTC symptom care as needed. Ensure adequate fluid intake and rest. May f/u with PCP or here as needed.  Reviewed expectations re: course of current medical issues. Questions answered. Outlined signs and symptoms indicating need for more acute intervention. Patient verbalized understanding. After Visit Summary given.   SUBJECTIVE: History from: patient.  Jessica Combs is a 68 y.o. female who presents with complaint of nasal congestion, post-nasal drainage, and a persistent dry cough; without sore throat. Onset abrupt, yesterday; with fatigue and with body aches. SOB: none. Wheezing: none. Fever: yes, subjective. Overall normal PO intake without n/v. Known sick contacts: no. No specific or significant aggravating or alleviating factors reported. OTC treatment: none reported.  Received flu shot this year: yes.  Social History   Tobacco Use  Smoking Status Never Smoker  Smokeless Tobacco Never Used    ROS: As per HPI.   OBJECTIVE:  Vitals:   10/05/18 1320 10/05/18 1321  BP: (!) 144/68   Pulse: 90   Resp: 18   Temp: (!) 102.3 F (39.1 C)   TempSrc: Tympanic   SpO2: 100%   Weight:  95.7 kg     General appearance: alert; appears fatigued HEENT: nasal congestion; clear runny nose; throat irritation secondary to post-nasal drainage Neck: supple without LAD CV:  RRR Lungs: unlabored respirations, symmetrical air entry without wheezing; cough: moderate Abd: soft Ext: no LE edema Skin: warm and dry Psychological: alert and cooperative; normal mood and affect  Allergies  Allergen Reactions  . Pravastatin Other (See Comments)    Dizziness    Past Medical History:  Diagnosis Date  . Arthritis of knee   . Bradycardia   . Bronchitis   . Osteopenia 01/27/2017  . Vaginitis     Social History   Socioeconomic History  . Marital status: Married    Spouse name: Not on file  . Number of children: Not on file  . Years of education: Not on file  . Highest education level: Not on file  Occupational History  . Not on file  Social Needs  . Financial resource strain: Not on file  . Food insecurity:    Worry: Not on file    Inability: Not on file  . Transportation needs:    Medical: Not on file    Non-medical: Not on file  Tobacco Use  . Smoking status: Never Smoker  . Smokeless tobacco: Never Used  Substance and Sexual Activity  . Alcohol use: No  . Drug use: No  . Sexual activity: Yes    Partners: Male    Birth control/protection: Post-menopausal  Lifestyle  . Physical activity:    Days per week: Not on file    Minutes per session: Not on file  . Stress: Not on file  Relationships  .  Social connections:    Talks on phone: Not on file    Gets together: Not on file    Attends religious service: Not on file    Active member of club or organization: Not on file    Attends meetings of clubs or organizations: Not on file    Relationship status: Not on file  . Intimate partner violence:    Fear of current or ex partner: Not on file    Emotionally abused: Not on file    Physically abused: Not on file    Forced sexual activity: Not on file  Other Topics Concern  . Not on file  Social History Narrative   Diet: Regular      Do you drink/ eat things with caffeine?Yes ,In small ammounts      Marital status: Married                               What year were you married ? 1976      Do you live in a house, apartment,assistred living, condo, trailer, etc.)? House      Is it one or more stories? One story      How many persons live in your home ?  2 people      Do you have any pets in your home ?(please list) No      Current or past profession: Certified Nursing Assistant      Do you exercise?  Yes                            Type & how often: Walking ( every day)      Do you have a living will? No      Do you have a DNR form?  No                     If not, do you want to discuss one? Yes      Do you have signed POA?HPOA forms?  No               If so, please bring to your        appointment              Vanessa Kick, MD 10/05/18 1345

## 2018-12-14 ENCOUNTER — Telehealth: Payer: Self-pay

## 2018-12-14 ENCOUNTER — Other Ambulatory Visit: Payer: Self-pay

## 2018-12-14 ENCOUNTER — Encounter: Payer: Self-pay | Admitting: Adult Health

## 2018-12-14 ENCOUNTER — Ambulatory Visit (INDEPENDENT_AMBULATORY_CARE_PROVIDER_SITE_OTHER): Payer: PRIVATE HEALTH INSURANCE | Admitting: Adult Health

## 2018-12-14 ENCOUNTER — Ambulatory Visit: Payer: PRIVATE HEALTH INSURANCE | Admitting: Internal Medicine

## 2018-12-14 DIAGNOSIS — E785 Hyperlipidemia, unspecified: Secondary | ICD-10-CM

## 2018-12-14 DIAGNOSIS — E6609 Other obesity due to excess calories: Secondary | ICD-10-CM | POA: Diagnosis not present

## 2018-12-14 DIAGNOSIS — M159 Polyosteoarthritis, unspecified: Secondary | ICD-10-CM

## 2018-12-14 DIAGNOSIS — M150 Primary generalized (osteo)arthritis: Secondary | ICD-10-CM | POA: Diagnosis not present

## 2018-12-14 DIAGNOSIS — Z23 Encounter for immunization: Secondary | ICD-10-CM

## 2018-12-14 DIAGNOSIS — Z6839 Body mass index (BMI) 39.0-39.9, adult: Secondary | ICD-10-CM

## 2018-12-14 MED ORDER — ZOSTER VAC RECOMB ADJUVANTED 50 MCG/0.5ML IM SUSR: 0.5000 mL | Freq: Once | INTRAMUSCULAR | 1 refills | Status: AC

## 2018-12-14 MED ORDER — TETANUS-DIPHTH-ACELL PERTUSSIS 5-2.5-18.5 LF-MCG/0.5 IM SUSP: 0.5000 mL | Freq: Once | INTRAMUSCULAR | 0 refills | Status: AC

## 2018-12-14 MED ORDER — IBUPROFEN 400 MG PO TABS: 400.0000 mg | ORAL_TABLET | Freq: Four times a day (QID) | ORAL | 0 refills | Status: AC | PRN

## 2018-12-14 NOTE — Progress Notes (Signed)
This service is provided via telemedicine  No vital signs collected/recorded due to the encounter was a telemedicine visit.   Location of patient (ex: home, work):  Home  Patient consents to a telephone visit:  Yes  Location of the provider (ex: office, home):  [employer], Office   Name of any referring provider:  Lauree Chandler, NP  Names of all persons participating in the telemedicine service and their role in the encounter:  Durenda Age, NP, Chrae/CMA, and Patient   Time spent on call: 8 min with medical assistant      DATE:  12/14/2018 MRN:  [id number]  BIRTHDAY: Aug 06, 1951   Contact Information    Name Relation Home Work Mobile   Anton Spouse [phone]   Tera Mater [phone]  [phone]          Chief Complaint  Patient presents with  . Medical Management of Chronic Issues    6 month follow-up. Patient c/o right knee pain and swelling. Patient denies injury to knee.   . Immunizations    Shingrix and TDaP rx sent to pharmacy     HISTORY OF PRESENT ILLNESS: This is a 68 year old female who is having a 38-month follow-up. She has been having right knee pain 3 months ago. She said that she has noticed that her right is swollen at times. She uses [employer] patch PRN which helps her. Her current weight is 211 lbs. She used to walk 3 miles a day but has stopped since her knee is bothering her. She uses an app that measures her steps and manages to get 12,00 steps a day at work. 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 She is due for her Shingrix and Tdap vaccine and usually gets it at her pharmacy. She said that she does not eat rice nor potatoes. She drinks  1L of water per day.   PAST MEDICAL HISTORY:  Past Medical History:  Diagnosis Date  . Arthritis of knee   . Bradycardia   . Bronchitis   . Osteopenia 01/27/2017  . Vaginitis      CURRENT MEDICATIONS:  Reviewed  Patient's Medications  New Prescriptions   No medications on file  Previous Medications   ATORVASTATIN (LIPITOR) 10 MG TABLET    TAKE 1/2 (ONE-HALF) TABLET BY MOUTH ONCE DAILY AT SUPPER   TDAP (BOOSTRIX) 5-2.5-18.5 LF-MCG/0.5 INJECTION    Inject 0.5 mLs into the muscle once.   ZOSTER VACCINE ADJUVANTED [facility]) INJECTION    Inject 0.5 mLs into the muscle once.  Modified Medications   No medications on file  Discontinued Medications   BENZONATATE (TESSALON) 100 MG CAPSULE    Take 1 capsule by mouth every 8 (eight) hours for cough.     Allergies  Allergen Reactions  . Pravastatin Other (See Comments)    Dizziness     REVIEW OF SYSTEMS:  GENERAL: no change in appetite, no fatigue, no weight changes, no fever, chills or weakness SKIN: Denies rash, itching, wounds, ulcer sores, or nail abnormality EYES: Denies change in vision, dry eyes, eye pain, itching or discharge EARS: Denies change in hearing, ringing in ears, or earache NOSE: Denies nasal congestion or epistaxis MOUTH and THROAT: Denies oral discomfort, gingival pain or bleeding, pain from teeth or hoarseness   RESPIRATORY: no cough, SOB, DOE, wheezing, hemoptysis CARDIAC: no chest pain, or palpitations GI: no abdominal pain, diarrhea, constipation, heart burn, nausea or vomiting GU: Denies dysuria, frequency,  hematuria, incontinence, or discharge MUSCULOSKELETAL: right knee pain CIRCULATION: Denies claudication, edema of legs, varicosities, or cold extremities NEUROLOGICAL: Denies dizziness, syncope, numbness, or headache PSYCHIATRIC: Denies feeling of depression or anxiety. No report of hallucinations, insomnia, paranoia, or agitation ENDOCRINE: Denies polyphagia, polyuria, polydipsia, heat or cold intolerance    LABS/RADIOLOGY: Labs reviewed: Basic Metabolic Panel: Recent Labs    06/15/18 1100  NA 144  K 4.0  CL 109  CO2 28  GLUCOSE 93  BUN 15  CREATININE 0.75  CALCIUM 8.9   Liver Function  Tests: Recent Labs    06/15/18 1100  AST 20  ALT 15  BILITOT 0.5  PROT 6.5   CBC: Recent Labs    06/15/18 1100  WBC 2.7*  NEUTROABS 878*  HGB 12.5  HCT 37.5  MCV 89.5  PLT 145   Lipid Panel: Recent Labs    06/15/18 1100  HDL 59      ASSESSMENT/PLAN:  1. Primary osteoarthritis involving multiple joints - continue Icy [employer] patch PRN, continue working on weight loss program to prevent additional stress on the knees - ibuprofen (IBU) 400 MG tablet; Take 1 tablet (400 mg total) by mouth every 6 (six) hours as needed for up to 14 days (take with food).  Dispense: 30 tablet; Refill: 0  2. Class 2 obesity due to excess calories without serious comorbidity with body mass index (BMI) of 39.0 to 39.9 in adult - she said that she was 214 lbs 6 months ago, keep working on the weight loss program, encourage to portion her meals and increase fluid intake to at least 8 cups of water a day to help increase her metabolism, continue with low-carb diet  3. Hyperlipidemia, unspecified hyperlipidemia type Lab Results  Component Value Date   CHOL 181 06/15/2018   HDL 59 06/15/2018   LDLCALC 105 (H) 06/15/2018   TRIG 76 06/15/2018   CHOLHDL 3.1 06/15/2018  -continue low fat and low carb diet   4. Immunization due - will get it from pharmacy - Tdap ([place]) 5-2.5-18.5 LF-MCG/0.5 injection; Inject 0.5 mLs into the muscle once for 1 dose.  Dispense: 0.5 mL; Refill: 0 - Zoster Vaccine Adjuvanted [facility]) injection; Inject 0.5 mLs into the muscle once for 1 dose.  Dispense: 0.5 mL; Refill: 1      Time spent on non face to face visit:  21 minutes  The patient gave consent to this telephone visit. Explained to the patient the risk and privacy issue that was involved with this telephone call.     Durenda Age, NP [employer] [phone]

## 2018-12-14 NOTE — Patient Instructions (Signed)
1. Primary osteoarthritis involving multiple joints - continue Icy Hot patch PRN, continue working on weight loss program to prevent additional stress on the knees - ibuprofen (IBU) 400 MG tablet; Take 1 tablet (400 mg total) by mouth every 6 (six) hours as needed for up to 14 days (take with food).  Dispense: 30 tablet; Refill: 0  2. Class 2 obesity due to excess calories without serious comorbidity with body mass index (BMI) of 39.0 to 39.9 in adult -  keep working on the weight loss program, encourage to portion her meals and increase fluid intake to at least 8 cups of water a day to help increase metabolism, continue with low-carb diet  3. Hyperlipidemia, unspecified hyperlipidemia type Lab Results  Component Value Date   CHOL 181 06/15/2018   HDL 59 06/15/2018   LDLCALC 105 (H) 06/15/2018   TRIG 76 06/15/2018   CHOLHDL 3.1 06/15/2018  -continue low fat and low carb diet - continue Lipitor  4. Immunization due - will get it from pharmacy - Tdap ([place]) 5-2.5-18.5 LF-MCG/0.5 injection; Inject 0.5 mLs into the muscle once for 1 dose.  Dispense: 0.5 mL; Refill: 0 - Zoster Vaccine Adjuvanted [facility]) injection; Inject 0.5 mLs into the muscle once for 1 dose.  Dispense: 0.5 mL; Refill: 1

## 2018-12-14 NOTE — Telephone Encounter (Signed)
  Made first attempt to contact patient to complete telephone visit. Left message informing patient I will make 2 more attempts prior to visit being canceled and rescheduled  S.Chrae B/CMA

## 2018-12-14 NOTE — Telephone Encounter (Signed)
Patient returned call. I completed my portion of the visit. Patient is awaiting call from [facility] to fully complete visit

## 2019-01-09 ENCOUNTER — Other Ambulatory Visit: Payer: Self-pay

## 2019-01-09 ENCOUNTER — Encounter: Payer: Self-pay | Admitting: Nurse Practitioner

## 2019-01-09 ENCOUNTER — Ambulatory Visit (INDEPENDENT_AMBULATORY_CARE_PROVIDER_SITE_OTHER): Payer: PRIVATE HEALTH INSURANCE | Admitting: Nurse Practitioner

## 2019-01-09 VITALS — BP 122/74 | HR 50 | Temp 98.1°F | Ht 62.0 in | Wt 212.2 lb

## 2019-01-09 DIAGNOSIS — M159 Polyosteoarthritis, unspecified: Secondary | ICD-10-CM

## 2019-01-09 DIAGNOSIS — M25561 Pain in right knee: Secondary | ICD-10-CM

## 2019-01-09 DIAGNOSIS — E785 Hyperlipidemia, unspecified: Secondary | ICD-10-CM

## 2019-01-09 DIAGNOSIS — M150 Primary generalized (osteo)arthritis: Secondary | ICD-10-CM

## 2019-01-09 DIAGNOSIS — E6609 Other obesity due to excess calories: Secondary | ICD-10-CM

## 2019-01-09 DIAGNOSIS — G8929 Other chronic pain: Secondary | ICD-10-CM

## 2019-01-09 DIAGNOSIS — Z6839 Body mass index (BMI) 39.0-39.9, adult: Secondary | ICD-10-CM

## 2019-01-09 NOTE — Patient Instructions (Addendum)
To use aleve 1 tablet twice daily (wth food) for 7 days Can use ice to knee twice daily with muscle rub afterward  Physical therapy ordered  If still having pain and discomfort to notify we will refer you to orthopedic.

## 2019-01-09 NOTE — Progress Notes (Signed)
Careteam: Patient Care Team: Lauree Chandler, NP as PCP - General (Nurse Practitioner)  Advanced Directive information    Allergies  Allergen Reactions  . Pravastatin Other (See Comments)    Dizziness    Chief Complaint  Patient presents with  . Acute Visit    Right knee and ankle pain     HPI: Patient is a 68 y.o. female seen in the office today due to right knee pan. Has been going and coming for years but in the last 4 weeks has been constant.  Talked to nurse practitioner on the phone and she recommended ibuprofein 400 mg by mouth as needed which helped the pain but never fixed the issue.  No injury.  Some swelling on the inside of the knee.     Review of Systems:  Review of Systems  Constitutional: Negative for chills, fever and weight loss.  Musculoskeletal: Positive for joint pain (knee). Negative for falls and neck pain.  Skin: Negative for itching and rash.    Past Medical History:  Diagnosis Date  . Arthritis of knee   . Bradycardia   . Bronchitis   . Osteopenia 01/27/2017  . Vaginitis    Past Surgical History:  Procedure Laterality Date  . BREAST BIOPSY Right   . BREAST BIOPSY Right    Social History:   reports that she has never smoked. She has never used smokeless tobacco. She reports that she does not drink alcohol or use drugs.  History reviewed. No pertinent family history.  Medications: Patient's Medications  New Prescriptions   No medications on file  Previous Medications   ATORVASTATIN (LIPITOR) 10 MG TABLET    TAKE 1/2 (ONE-HALF) TABLET BY MOUTH ONCE DAILY AT SUPPER   IBUPROFEN (ADVIL) 400 MG TABLET    Take 400 mg by mouth as needed.  Modified Medications   No medications on file  Discontinued Medications   No medications on file    Physical Exam:  Vitals:   01/09/19 0803  BP: 122/74  Pulse: (!) 50  Temp: 98.1 F (36.7 C)  TempSrc: Oral  SpO2: 99%  Weight: 212 lb 3.2 oz (96.3 kg)  Height: 5\' 2"  (1.575 m)   Body  mass index is 38.81 kg/m. Wt Readings from Last 3 Encounters:  01/09/19 212 lb 3.2 oz (96.3 kg)  10/05/18 211 lb (95.7 kg)  06/15/18 215 lb (97.5 kg)    Physical Exam Constitutional:      Appearance: Normal appearance.  Musculoskeletal:     Right knee: She exhibits normal range of motion, no swelling and no effusion. Tenderness found. Medial joint line and lateral joint line tenderness noted.  Neurological:     Mental Status: She is alert.     Labs reviewed: Basic Metabolic Panel: Recent Labs    06/15/18 1100  NA 144  K 4.0  CL 109  CO2 28  GLUCOSE 93  BUN 15  CREATININE 0.75  CALCIUM 8.9   Liver Function Tests: Recent Labs    06/15/18 1100  AST 20  ALT 15  BILITOT 0.5  PROT 6.5   No results for input(s): LIPASE, AMYLASE in the last 8760 hours. No results for input(s): AMMONIA in the last 8760 hours. CBC: Recent Labs    06/15/18 1100  WBC 2.7*  NEUTROABS 878*  HGB 12.5  HCT 37.5  MCV 89.5  PLT 145   Lipid Panel: Recent Labs    06/15/18 1100  CHOL 181  HDL 59  LDLCALC 105*  TRIG 76  CHOLHDL 3.1   TSH: No results for input(s): TSH in the last 8760 hours. A1C: No results found for: HGBA1C   Assessment/Plan 1. Chronic pain of right knee -acute on chronic pain of right knee. -to use aleve by mouth twice daily with food for 7 days -can use ice and/or muscle rub as needed  - Ambulatory referral to Physical Therapy  2. Class 2 obesity due to excess calories without serious comorbidity with body mass index (BMI) of 39.0 to 39.9 in adult -discussed importance of diet and exercise for weight reduction.  - CBC with Differential/Platelet  3. Primary osteoarthritis involving multiple joints -ongoing, worsening OA in knee. Continues to use ibuprofen for severe pain. - CBC with Differential/Platelet  4. Hyperlipidemia, unspecified hyperlipidemia type -continues on lipitor and diet modifications. - COMPLETE METABOLIC PANEL WITH GFR - Lipid Panel   Next appt: 6 month follow up for routine visit.  Jessica Combs. [place], [facility] [phone]

## 2019-01-10 LAB — COMPLETE METABOLIC PANEL WITH GFR
AG Ratio: 1.5 (calc) (ref 1.0–2.5)
ALT: 13 U/L (ref 6–29)
AST: 18 U/L (ref 10–35)
Albumin: 4.1 g/dL (ref 3.6–5.1)
Alkaline phosphatase (APISO): 57 U/L (ref 37–153)
BUN: 13 mg/dL (ref 7–25)
CO2: 27 mmol/L (ref 20–32)
Calcium: 9.5 mg/dL (ref 8.6–10.4)
Chloride: 109 mmol/L (ref 98–110)
Creat: 0.8 mg/dL (ref 0.50–0.99)
GFR, Est African American: 88 mL/min/{1.73_m2} (ref >=60)
GFR, Est Non African American: 76 mL/min/{1.73_m2} (ref >=60)
Globulin: 2.8 g/dL (calc) (ref 1.9–3.7)
Glucose, Bld: 96 mg/dL (ref 65–99)
Potassium: 4.1 mmol/L (ref 3.5–5.3)
Sodium: 143 mmol/L (ref 135–146)
Total Bilirubin: 0.5 mg/dL (ref 0.2–1.2)
Total Protein: 6.9 g/dL (ref 6.1–8.1)

## 2019-01-10 LAB — CBC WITH DIFFERENTIAL/PLATELET
Absolute Monocytes: 200 cells/uL (ref 200–950)
Basophils Absolute: 20 cells/uL (ref 0–200)
Basophils Relative: 0.7 %
Eosinophils Absolute: 61 cells/uL (ref 15–500)
Eosinophils Relative: 2.1 %
HCT: 39 % (ref 35.0–45.0)
Hemoglobin: 12.8 g/dL (ref 11.7–15.5)
Lymphs Abs: 1459 cells/uL (ref 850–3900)
MCH: 30.3 pg (ref 27.0–33.0)
MCHC: 32.8 g/dL (ref 32.0–36.0)
MCV: 92.2 fL (ref 80.0–100.0)
MPV: 12.5 fL (ref 7.5–12.5)
Monocytes Relative: 6.9 %
Neutro Abs: 1160 cells/uL — ABNORMAL LOW (ref 1500–7800)
Neutrophils Relative %: 40 %
Platelets: 145 10*3/uL (ref 140–400)
RBC: 4.23 10*6/uL (ref 3.80–5.10)
RDW: 13.4 % (ref 11.0–15.0)
Total Lymphocyte: 50.3 %
WBC: 2.9 10*3/uL — ABNORMAL LOW (ref 3.8–10.8)

## 2019-01-10 LAB — LIPID PANEL
Cholesterol: 179 mg/dL (ref <200)
HDL: 56 mg/dL (ref >=50)
LDL Cholesterol (Calc): 104 mg/dL (calc) — ABNORMAL HIGH
Non-HDL Cholesterol (Calc): 123 mg/dL (calc) (ref <130)
Total CHOL/HDL Ratio: 3.2 (calc) (ref <5.0)
Triglycerides: 93 mg/dL (ref <150)

## 2019-01-18 ENCOUNTER — Ambulatory Visit: Payer: PRIVATE HEALTH INSURANCE | Attending: Nurse Practitioner

## 2019-01-18 ENCOUNTER — Other Ambulatory Visit: Payer: Self-pay

## 2019-01-18 DIAGNOSIS — R262 Difficulty in walking, not elsewhere classified: Secondary | ICD-10-CM | POA: Diagnosis present

## 2019-01-18 DIAGNOSIS — M6281 Muscle weakness (generalized): Secondary | ICD-10-CM | POA: Diagnosis present

## 2019-01-18 DIAGNOSIS — M25561 Pain in right knee: Secondary | ICD-10-CM | POA: Diagnosis not present

## 2019-01-18 NOTE — Therapy (Signed)
[place], [place], [address] Phone: [phone]   Fax:  [phone]  Physical Therapy Evaluation  Patient Details  Name: Jessica Combs MRN: [id number] Date of Birth: 10/12/1950 Referring Provider (PT): Sherrie Mustache , NP   Encounter Date: 01/18/2019  PT End of Session - 01/18/19 1139    Visit Number  1    Number of Visits  12    Date for PT Re-Evaluation  03/03/19    Authorization Type  [employer]    PT Start Time  1046    PT Stop Time  1130    PT Time Calculation (min)  44 min    Activity Tolerance  Patient tolerated treatment well;Patient limited by pain    Behavior During Therapy  [facility] for tasks assessed/performed       Past Medical History:  Diagnosis Date  . Arthritis of knee   . Bradycardia   . Bronchitis   . Osteopenia 01/27/2017  . Vaginitis     Past Surgical History:  Procedure Laterality Date  . BREAST BIOPSY Right   . BREAST BIOPSY Right     There were no vitals filed for this visit.   Subjective Assessment - 01/18/19 1054    Subjective  She reports RT knee pain limiting walking. Sometimes it swells and burns.   Pain onset for 1-2 months.     Limitations  Walking;Standing   pushing cart at work.    How long can you stand comfortably?  soreness but able to stand as needed    How long can you walk comfortably?  As needed but with limp at times    Diagnostic tests  No tests.     Patient Stated Goals  Decr pain and walk better    Currently in Pain?  No/denies    Pain Location  Knee    Pain Orientation  Right;Anterior    Pain Descriptors / Indicators  Burning;Aching    Pain Type  Chronic pain    Pain Onset  More than a month ago    Pain Frequency  Rarely    Aggravating Factors   activity on feet    Pain Relieving Factors  rest, gentle movement, cold, bengay         [facility] PT Assessment - 01/18/19 0001      Assessment   Medical Diagnosis  RT knee pain    Referring  Provider (PT)  Sherrie Mustache , NP    Onset Date/Surgical Date  --   1-2 months   Next MD Visit  As needed    Prior Therapy  Not for RT knee      Precautions   Precautions  None      Restrictions   Weight Bearing Restrictions  No      Balance Screen   Has the patient fallen in the past 6 months  No      Prior Function   Level of Independence  Independent    Vocation  Full time employment    Vocation Requirements  on feet for 4-5 hours      Cognition   Overall Cognitive Status  Within Functional Limits for tasks assessed      Observation/Other Assessments   Focus on Therapeutic Outcomes (FOTO)   50% limited      ROM / Strength   AROM / PROM / Strength  AROM;PROM;Strength      AROM   AROM Assessment Site  Knee  Right/Left Knee  Right;Left    Right Knee Extension  0    Right Knee Flexion  115    Left Knee Extension  0    Left Knee Flexion  120      Strength   Overall Strength Comments  RT hip appears weak but I think she is fearful due toi knee pain     Strength Assessment Site  Knee    Right/Left Knee  Left;Right    Right Knee Flexion  4+/5    Right Knee Extension  4+/5   pain   Left Knee Flexion  5/5    Left Knee Extension  5/5      Ambulation/Gait   Gait Comments  antalgic RT leg ,  pain and difficulty getting out of chair and for first 2-3 steps., no device                Objective measurements completed on examination: See above findings.              PT Education - 01/18/19 1119    Education Details  POC HEP    Person(s) Educated  Patient    Methods  Explanation;Tactile cues;Verbal cues;Handout    Comprehension  Verbalized understanding;Returned demonstration       PT Short Term Goals - 01/18/19 1134      PT SHORT TERM GOAL #1   Title  She will be indpendent with initial HEp     Time  2    Period  Weeks    Status  New      PT SHORT TERM GOAL #2   Title  She will report pain decr 25% or more with walking    Time  3     Period  Weeks    Status  New      PT SHORT TERM GOAL #3   Title  She will be able to flex and extend leg with improved smoothness due  to decr pain    Time  3    Period  Weeks    Status  New      PT SHORT TERM GOAL #4   Title  She will be able to do a SLR RT equal LT    Time  3    Period  Weeks    Status  New        PT Long Term Goals - 01/18/19 1137      PT LONG TERM GOAL #1   Title  She will be independnet with all hEP issued    Time  6    Period  Weeks    Status  New      PT LONG TERM GOAL #2   Title  She will report 1-2 max pain with work and home activity    Time  6    Period  Weeks    Status  New      PT LONG TERM GOAL #3   Title  She will report  min to no swelling or  shooting pain into lower leg    Time  6    Period  Weeks    Status  New      PT LONG TERM GOAL #4   Title  She will be able to stand from chair without hesitation and walkwithou hesitation for first 2-3 steps    Time  6    Period  Weeks    Status  New  Plan - 01/18/19 1128    Clinical Impression Statement  MS Darwin presents with RT knee painincreaed with activity on feet located mostly towards pes ansurene but also medial joint.  She demo weakness in quads and hamstrings and also LRT hip making walking difficult at times.   She should improve with skilled PT unless there is underlying OA     Examination-Activity Limitations  Locomotion Level;Stairs;Stand    Examination-Participation Restrictions  Community Activity    Stability/Clinical Decision Making  Stable/Uncomplicated    Clinical Decision Making  Low    Rehab Potential  Good    PT Frequency  2x / week    PT Duration  6 weeks    PT Treatment/Interventions  Taping;Passive range of motion;Dry needling;Manual techniques;Therapeutic exercise;Patient/family education;Iontophoresis 4mg /ml Dexamethasone;Moist Heat;Ultrasound    PT Next Visit Plan  Reveiw HEP , manual and modalities    PT Home Exercise Plan  supine hip  abduction , side clam and reverse clam,  QS, bridge    Consulted and Agree with Plan of Care  Patient       Patient will benefit from skilled therapeutic intervention in order to improve the following deficits and impairments:  Pain, Increased muscle spasms, Decreased strength, Difficulty walking, Decreased activity tolerance  Visit Diagnosis: Acute pain of right knee  Muscle weakness (generalized)  Difficulty in walking, not elsewhere classified     Problem List Patient Active Problem List   Diagnosis Date Noted  . 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 Bradycardia 11/23/2016  . Hyperlipidemia 07/28/2016  . UTERINE FIBROID 10/07/2006  . OBESITY, NOS 10/07/2006    Darrel Hoover  PT 01/18/2019, 11:41 AM  [facility] [address] [place], [place], [address] Phone: [phone]   Fax:  [phone]  Name: BEA DUREN MRN: [id number] Date of Birth: [birth year]

## 2019-01-18 NOTE — Patient Instructions (Signed)
ANKLE: Pumps    Point toes down, then up. _10-20__ reps per set, _1-2__ sets per day, _7__ days per week   Copyright  VHI. All rights reserved.  Quad Set    With other leg bent, foot flat, slowly tighten muscles on thigh of straight leg while counting out loud to _5___. Repeat with other leg. Repeat _10-20___ times. Do _2___ sessions per day.  http://gt2.exer.us/276   Copyright  VHI. All rights reserved.  Clam    Lie on side, legs bent 90. Open top knee to ceiling, rotating leg outward. Touch toes to ankle of bottom leg. Close knees, rotating leg inward. Maintain hip position. Repeat __10-20__ times. Repeat on other side. Do _1-2___ sessions per day.  http://pm.exer.us/69   Copyright  VHI. All rights reserved.  Hip Abduction / Adduction: with Extended Knee (Supine)    Bring left leg out to side and return. Keep knee straight. Repeat ___10_ times per set. Do __1-2__ sets per session. Do __2__ sessions per day.   LEt still for 5 sec then return back  http://orth.exer.us/681   Copyright  VHI. All rights reserved.  Bridge    Lying on back, legs bent 90, feet flat on floor. Press up hips ,  10-20 reps  1-2x/day   5 days per week . ADVANCED: Clasp hands underneath back and squeeze shoulder blades together, lifting upper body onto outside of shoulders.  Copyright  VHI. All rights reserved.

## 2019-01-24 ENCOUNTER — Encounter: Payer: Self-pay | Admitting: Physical Therapy

## 2019-01-24 ENCOUNTER — Other Ambulatory Visit: Payer: Self-pay

## 2019-01-24 ENCOUNTER — Ambulatory Visit: Payer: PRIVATE HEALTH INSURANCE | Admitting: Physical Therapy

## 2019-01-24 DIAGNOSIS — M25561 Pain in right knee: Secondary | ICD-10-CM

## 2019-01-24 DIAGNOSIS — M6281 Muscle weakness (generalized): Secondary | ICD-10-CM

## 2019-01-24 DIAGNOSIS — R262 Difficulty in walking, not elsewhere classified: Secondary | ICD-10-CM

## 2019-01-24 NOTE — Therapy (Signed)
[place], [place], [address] Phone: [phone]   Fax:  [phone]  Physical Therapy Treatment  Patient Details  Name: Jessica Combs MRN: [id number] Date of Birth: 08/17/1950 Referring Provider (PT): Sherrie Mustache , NP   Encounter Date: 01/24/2019  PT End of Session - 01/24/19 1312    Visit Number  2    Number of Visits  12    Date for PT Re-Evaluation  03/03/19    PT Start Time  1238    PT Stop Time  1318    PT Time Calculation (min)  40 min       Past Medical History:  Diagnosis Date  . Arthritis of knee   . Bradycardia   . Bronchitis   . Osteopenia 01/27/2017  . Vaginitis     Past Surgical History:  Procedure Laterality Date  . BREAST BIOPSY Right   . BREAST BIOPSY Right     There were no vitals filed for this visit.  Subjective Assessment - 01/24/19 1243    Subjective  A little bit of difference.    Currently in Pain?  Yes    Pain Score  4     Pain Location  Knee    Pain Orientation  Right    Pain Descriptors / Indicators  Aching    Pain Type  Chronic pain    Aggravating Factors   activity on feet    Pain Relieving Factors  rest, gentle movement, cold , bengay                       [facility] Adult PT Treatment/Exercise - 01/24/19 0001      Exercises   Exercises  Knee/Hip      Knee/Hip Exercises: Stretches   Active Hamstring Stretch  3 reps;30 seconds    Active Hamstring Stretch Limitations  seated and supine      Knee/Hip Exercises: Aerobic   Nustep  L5 x 5 minutes       Knee/Hip Exercises: Seated   Long Arc Quad  20 reps    Hamstring Curl  20 reps    Sit to [employer]  10 reps      Knee/Hip Exercises: Supine   Quad Sets  2 sets;10 reps    Heel Slides  10 reps    Hip Adduction Isometric  10 reps    Bridges  2 sets;10 reps    Other Supine Knee/Hip Exercises  supine hip abduction 10 x 2 each     Other Supine Knee/Hip Exercises  ankle pumps 10 x 2       Knee/Hip Exercises: Sidelying   Clams  10 x2    right   Other Sidelying Knee/Hip Exercises  reverse clam 10 x2    rt              PT Short Term Goals - 01/18/19 1134      PT SHORT TERM GOAL #1   Title  She will be indpendent with initial HEp     Time  2    Period  Weeks    Status  New      PT SHORT TERM GOAL #2   Title  She will report pain decr 25% or more with walking    Time  3    Period  Weeks    Status  New      PT SHORT TERM GOAL #3   Title  She  will be able to flex and extend leg with improved smoothness due  to decr pain    Time  3    Period  Weeks    Status  New      PT SHORT TERM GOAL #4   Title  She will be able to do a SLR RT equal LT    Time  3    Period  Weeks    Status  New        PT Long Term Goals - 01/18/19 1137      PT LONG TERM GOAL #1   Title  She will be independnet with all hEP issued    Time  6    Period  Weeks    Status  New      PT LONG TERM GOAL #2   Title  She will report 1-2 max pain with work and home activity    Time  6    Period  Weeks    Status  New      PT LONG TERM GOAL #3   Title  She will report  min to no swelling or  shooting pain into lower leg    Time  6    Period  Weeks    Status  New      PT LONG TERM GOAL #4   Title  She will be able to stand from chair without hesitation and walkwithou hesitation for first 2-3 steps    Time  6    Period  Weeks    Status  New            Plan - 01/24/19 1327    Clinical Impression Statement  Pt reports min improvement since eval. We reviewed HEP and progressed with Nustep and hamstring strength. Needs hands on thighs to rise from mat table.    PT Next Visit Plan  Reveiw HEP , manual and modalities; add to HEP- begin [place] and manula to medial knee    PT Home Exercise Plan  supine hip abduction , side clam and reverse clam,  QS, bridge       Patient will benefit from skilled therapeutic intervention in order to improve the following deficits and  impairments:  Pain, Increased muscle spasms, Decreased strength, Difficulty walking, Decreased activity tolerance  Visit Diagnosis: 1. Acute pain of right knee   2. Muscle weakness (generalized)   3. Difficulty in walking, not elsewhere classified        Problem List Patient Active Problem List   Diagnosis Date Noted  . 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 Bradycardia 11/23/2016  . Hyperlipidemia 07/28/2016  . UTERINE FIBROID 10/07/2006  . OBESITY, NOS 10/07/2006    Dorene Ar, PTA 01/24/2019, 1:31 PM  [facility] [address] [place], [place], [address] Phone: [phone]   Fax:  [phone]  Name: Jessica Combs MRN: [id number] Date of Birth: 1951-03-13

## 2019-01-26 ENCOUNTER — Encounter: Payer: Self-pay | Admitting: Physical Therapy

## 2019-01-26 ENCOUNTER — Ambulatory Visit: Payer: PRIVATE HEALTH INSURANCE | Admitting: Physical Therapy

## 2019-01-26 ENCOUNTER — Other Ambulatory Visit: Payer: Self-pay

## 2019-01-26 DIAGNOSIS — R262 Difficulty in walking, not elsewhere classified: Secondary | ICD-10-CM

## 2019-01-26 DIAGNOSIS — M6281 Muscle weakness (generalized): Secondary | ICD-10-CM

## 2019-01-26 DIAGNOSIS — M25561 Pain in right knee: Secondary | ICD-10-CM

## 2019-01-26 NOTE — Therapy (Signed)
[place], [place], [address] Phone: [phone]   Fax:  [phone]  Physical Therapy Treatment  Patient Details  Name: Jessica Combs MRN: [id number] Date of Birth: 09/17/1950 Referring Provider (PT): Sherrie Mustache , NP   Encounter Date: 01/26/2019  PT End of Session - 01/26/19 1142    Visit Number  3    Number of Visits  12    Date for PT Re-Evaluation  03/03/19    PT Start Time  1139    PT Stop Time  1220    PT Time Calculation (min)  41 min       Past Medical History:  Diagnosis Date  . Arthritis of knee   . Bradycardia   . Bronchitis   . Osteopenia 01/27/2017  . Vaginitis     Past Surgical History:  Procedure Laterality Date  . BREAST BIOPSY Right   . BREAST BIOPSY Right     There were no vitals filed for this visit.  Subjective Assessment - 01/26/19 1141    Subjective  7/10 right knee pain. Left knee no pain today. I had difficulty with pain in right knee over night.    Currently in Pain?  Yes    Pain Score  7     Pain Location  Knee    Pain Orientation  Right    Pain Descriptors / Indicators  Aching;Sore                       [facility] Adult PT Treatment/Exercise - 01/26/19 0001      Knee/Hip Exercises: Stretches   Active Hamstring Stretch  3 reps;30 seconds    Active Hamstring Stretch Limitations  supine with strap      Knee/Hip Exercises: Aerobic   Nustep  L4 x 5 minutes       Knee/Hip Exercises: Seated   Long Arc Quad  20 reps      Knee/Hip Exercises: Supine   Quad Sets  2 sets;10 reps    Heel Slides  20 reps    Bridges  2 sets;10 reps    Other Supine Knee/Hip Exercises  --    Other Supine Knee/Hip Exercises  ankle pumps 10 x 2      Knee/Hip Exercises: Sidelying   Hip ABduction  10 reps    Clams  10 x2    right   Other Sidelying Knee/Hip Exercises  reverse clam 10 x2    rt     Modalities   Modalities  Iontophoresis;Ultrasound      Ultrasound   Ultrasound Location  right medial knee     Ultrasound Parameters  50% .8 w/cm2 1 mhz x 8 min    Ultrasound Goals  Edema;Pain      Iontophoresis   Type of Iontophoresis  Dexamethasone    Location  right medial knee    Dose  38ml    Time  6 hour patch               PT Short Term Goals - 01/18/19 1134      PT SHORT TERM GOAL #1   Title  She will be indpendent with initial HEp     Time  2    Period  Weeks    Status  New      PT SHORT TERM GOAL #2   Title  She will report pain decr 25% or more with walking    Time  3    Period  Weeks    Status  New      PT SHORT TERM GOAL #3   Title  She will be able to flex and extend leg with improved smoothness due  to decr pain    Time  3    Period  Weeks    Status  New      PT SHORT TERM GOAL #4   Title  She will be able to do a SLR RT equal LT    Time  3    Period  Weeks    Status  New        PT Long Term Goals - 01/18/19 1137      PT LONG TERM GOAL #1   Title  She will be independnet with all hEP issued    Time  6    Period  Weeks    Status  New      PT LONG TERM GOAL #2   Title  She will report 1-2 max pain with work and home activity    Time  6    Period  Weeks    Status  New      PT LONG TERM GOAL #3   Title  She will report  min to no swelling or  shooting pain into lower leg    Time  6    Period  Weeks    Status  New      PT LONG TERM GOAL #4   Title  She will be able to stand from chair without hesitation and walkwithou hesitation for first 2-3 steps    Time  6    Period  Weeks    Status  New            Plan - 01/26/19 1225    Clinical Impression Statement  Pt reports increased pain last night while trying to sleep. She is unsure of cause. She did her HEP yesterday without difficulty. Reviewed and performed modalities to decreased pain. ionto education provided.    PT Next Visit Plan  assess response to US/ionto; Reveiw HEP , manual and modalities; add to HEP-    PT Home  Exercise Plan  supine hip abduction , side clam and reverse clam,  QS, bridge    Consulted and Agree with Plan of Care  Patient       Patient will benefit from skilled therapeutic intervention in order to improve the following deficits and impairments:  Pain, Increased muscle spasms, Decreased strength, Difficulty walking, Decreased activity tolerance  Visit Diagnosis: 1. Acute pain of right knee   2. Muscle weakness (generalized)   3. Difficulty in walking, not elsewhere classified        Problem List Patient Active Problem List   Diagnosis Date Noted  . 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 Bradycardia 11/23/2016  . Hyperlipidemia 07/28/2016  . UTERINE FIBROID 10/07/2006  . OBESITY, NOS 10/07/2006    Dorene Ar, PTA 01/26/2019, 12:27 PM  [facility] [address] [place], [place], [address] Phone: [phone]   Fax:  [phone]  Name: Jessica Combs MRN: [id number] Date of Birth: 07-25-1951

## 2019-01-31 ENCOUNTER — Ambulatory Visit: Payer: PRIVATE HEALTH INSURANCE | Admitting: Physical Therapy

## 2019-01-31 ENCOUNTER — Other Ambulatory Visit: Payer: Self-pay

## 2019-01-31 ENCOUNTER — Encounter: Payer: Self-pay | Admitting: Physical Therapy

## 2019-01-31 DIAGNOSIS — R262 Difficulty in walking, not elsewhere classified: Secondary | ICD-10-CM

## 2019-01-31 DIAGNOSIS — M6281 Muscle weakness (generalized): Secondary | ICD-10-CM

## 2019-01-31 DIAGNOSIS — M25561 Pain in right knee: Secondary | ICD-10-CM

## 2019-01-31 NOTE — Therapy (Signed)
[place], [place], [address] Phone: [phone]   Fax:  [phone]  Physical Therapy Treatment  Patient Details  Name: NIOKA THORINGTON MRN: [id number] Date of Birth: 12/29/1950 Referring Provider (PT): Sherrie Mustache , NP   Encounter Date: 01/31/2019  PT End of Session - 01/31/19 1254    Visit Number  4    Number of Visits  12    Authorization Type  [employer]    PT Start Time  9390    PT Stop Time  1315    PT Time Calculation (min)  39 min       Past Medical History:  Diagnosis Date  . Arthritis of knee   . Bradycardia   . Bronchitis   . Osteopenia 01/27/2017  . Vaginitis     Past Surgical History:  Procedure Laterality Date  . BREAST BIOPSY Right   . BREAST BIOPSY Right     There were no vitals filed for this visit.  Subjective Assessment - 01/31/19 1244    Subjective  Less pain, I think the patch helped.    Currently in Pain?  Yes    Pain Score  3     Pain Location  Knee    Pain Orientation  Right    Pain Descriptors / Indicators  Aching;Sore    Aggravating Factors   activity on feet    Pain Relieving Factors  rest, ionto         [facility] PT Assessment - 01/31/19 0001      AROM   Right Knee Flexion  120                   [facility] Adult PT Treatment/Exercise - 01/31/19 0001      Knee/Hip Exercises: Seated   Long Arc Quad  20 reps    Long Arc Quad Weight  3 lbs.    Hamstring Curl  20 reps    Hamstring Limitations  red    Sit to Sand  10 reps      Knee/Hip Exercises: Supine   Quad Sets  --    Hip Adduction Isometric  10 reps    Bridges  10 reps    Bridges with [employer]  10 reps    Straight Leg Raises  10 reps    Straight Leg Raise with External Rotation  10 reps      Knee/Hip Exercises: Sidelying   Hip ABduction  --    Clams  10 x2    right, left   Other Sidelying Knee/Hip Exercises  reverse clam 10 x2    right, left     Ultrasound   Ultrasound  Location  right medial knee     Ultrasound Parameters  20% .8w/cm2 x 60mhz x 8 min    Ultrasound Goals  Edema;Pain      Iontophoresis   Type of Iontophoresis  Dexamethasone    Location  right medial knee    Dose  36ml    Time  6 hour patch               PT Short Term Goals - 01/18/19 1134      PT SHORT TERM GOAL #1   Title  She will be indpendent with initial HEp     Time  2    Period  Weeks    Status  New      PT SHORT TERM GOAL #2   Title  She  will report pain decr 25% or more with walking    Time  3    Period  Weeks    Status  New      PT SHORT TERM GOAL #3   Title  She will be able to flex and extend leg with improved smoothness due  to decr pain    Time  3    Period  Weeks    Status  New      PT SHORT TERM GOAL #4   Title  She will be able to do a SLR RT equal LT    Time  3    Period  Weeks    Status  New        PT Long Term Goals - 01/18/19 1137      PT LONG TERM GOAL #1   Title  She will be independnet with all hEP issued    Time  6    Period  Weeks    Status  New      PT LONG TERM GOAL #2   Title  She will report 1-2 max pain with work and home activity    Time  6    Period  Weeks    Status  New      PT LONG TERM GOAL #3   Title  She will report  min to no swelling or  shooting pain into lower leg    Time  6    Period  Weeks    Status  New      PT LONG TERM GOAL #4   Title  She will be able to stand from chair without hesitation and walkwithou hesitation for first 2-3 steps    Time  6    Period  Weeks    Status  New            Plan - 01/31/19 1317    Clinical Impression Statement  Pt reports less swelling and pain at medial knee after last session. Continued with gentle knee strength and ROM. AROM right knee flexion improved. Repeated ionto and [place] to medial right knee.    PT Next Visit Plan  continue US/ionto if still helpful; Reveiw HEP , manual and modalities; add to HEP-    PT Home Exercise Plan  supine hip abduction , side  clam and reverse clam,  QS, bridge       Patient will benefit from skilled therapeutic intervention in order to improve the following deficits and impairments:  Pain, Increased muscle spasms, Decreased strength, Difficulty walking, Decreased activity tolerance  Visit Diagnosis: 1. Acute pain of right knee   2. Muscle weakness (generalized)   3. Difficulty in walking, not elsewhere classified        Problem List Patient Active Problem List   Diagnosis Date Noted  . 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 Bradycardia 11/23/2016  . Hyperlipidemia 07/28/2016  . UTERINE FIBROID 10/07/2006  . OBESITY, NOS 10/07/2006    Dorene Ar, PTA 01/31/2019, 1:19 PM  [facility] [address] [place], [place], [address] Phone: [phone]   Fax:  [phone]  Name: YANINA KNUPP MRN: [id number] Date of Birth: 03/21/1951

## 2019-02-02 ENCOUNTER — Other Ambulatory Visit: Payer: Self-pay

## 2019-02-02 ENCOUNTER — Ambulatory Visit: Payer: PRIVATE HEALTH INSURANCE

## 2019-02-02 DIAGNOSIS — M25561 Pain in right knee: Secondary | ICD-10-CM

## 2019-02-02 DIAGNOSIS — R262 Difficulty in walking, not elsewhere classified: Secondary | ICD-10-CM

## 2019-02-02 DIAGNOSIS — M6281 Muscle weakness (generalized): Secondary | ICD-10-CM

## 2019-02-02 NOTE — Therapy (Signed)
[place], [place], [address] Phone: [phone]   Fax:  [phone]  Physical Therapy Treatment  Patient Details  Name: Jessica Combs MRN: [id number] Date of Birth: 04-09-1951 Referring Provider (PT): Sherrie Mustache , NP   Encounter Date: 02/02/2019  PT End of Session - 02/02/19 1014    Visit Number  5    Number of Visits  12    Date for PT Re-Evaluation  03/03/19    Authorization Type  [employer]    PT Start Time  1015   pt late   PT Stop Time  1108    PT Time Calculation (min)  53 min    Activity Tolerance  Patient tolerated treatment well;Patient limited by pain    Behavior During Therapy  [facility] for tasks assessed/performed       Past Medical History:  Diagnosis Date  . Arthritis of knee   . Bradycardia   . Bronchitis   . Osteopenia 01/27/2017  . Vaginitis     Past Surgical History:  Procedure Laterality Date  . BREAST BIOPSY Right   . BREAST BIOPSY Right     There were no vitals filed for this visit.  Subjective Assessment - 02/02/19 1015    Subjective  She reports some better . Patella felt locked and could not flex knee for 6 hours. She iced and  woke with soreness in spot but not locked.    Pain Score  3     Pain Location  Knee    Pain Orientation  Right    Pain Descriptors / Indicators  Aching    Pain Type  Chronic pain    Pain Onset  More than a month ago    Pain Frequency  Intermittent                       [facility] Adult PT Treatment/Exercise - 02/02/19 0001      Knee/Hip Exercises: Seated   Hamstring Curl  20 reps      Knee/Hip Exercises: Supine   Bridges  20 reps    Bridges with [employer]  15 reps   buttock lift   Straight Leg Raises  10 reps    Other Supine Knee/Hip Exercises  clam green x15      Knee/Hip Exercises: Sidelying   Hip ABduction  10 reps    Hip ABduction Limitations  RT/LTgreen band    Other Sidelying Knee/Hip Exercises   reverse clam 10 x2       Ultrasound   Ultrasound Location  RT medial knee and thigh    Ultrasound Parameters  100% 1.6 Wcm2 100%    Ultrasound Goals  Pain      Iontophoresis   Type of Iontophoresis  Dexamethasone    Location  right medial knee    Dose  67ml    Time  6 hour patch       HMP x 8 min medial RT thigh      PT Education - 02/02/19 1102    Education Details  use of model for possiblity as to why she had locked knee and pain with cartlidge tear or patella subluxation    Person(s) Educated  Patient    Methods  Explanation    Comprehension  Verbalized understanding       PT Short Term Goals - 02/02/19 1104      PT SHORT TERM GOAL #1   Title  She will  be indpendent with initial HEp     Status  Achieved      PT SHORT TERM GOAL #2   Title  She will report pain decr 25% or more with walking    Status  On-going      PT SHORT TERM GOAL #3   Title  She will be able to flex and extend leg with improved smoothness due  to decr pain    Status  Achieved      PT SHORT TERM GOAL #4   Title  She will be able to do a SLR RT equal LT    Status  Achieved        PT Long Term Goals - 01/18/19 1137      PT LONG TERM GOAL #1   Title  She will be independnet with all hEP issued    Time  6    Period  Weeks    Status  New      PT LONG TERM GOAL #2   Title  She will report 1-2 max pain with work and home activity    Time  6    Period  Weeks    Status  New      PT LONG TERM GOAL #3   Title  She will report  min to no swelling or  shooting pain into lower leg    Time  6    Period  Weeks    Status  New      PT LONG TERM GOAL #4   Title  She will be able to stand from chair without hesitation and walkwithou hesitation for first 2-3 steps    Time  6    Period  Weeks    Status  New            Plan - 02/02/19 1103    Clinical Impression Statement  Conyinue sore in medial soft tissues RT knee/thigh. She had incident and it is not clear why she had locked knee.  She is fine now.    PT Treatment/Interventions  Taping;Passive range of motion;Dry needling;Manual techniques;Therapeutic exercise;Patient/family education;Iontophoresis 4mg /ml Dexamethasone;Moist Heat;Ultrasound    PT Next Visit Plan  continue US/ionto if still helpful; Reveiw HEP , manual and modalities; add to HEP-    PT Home Exercise Plan  supine hip abduction , side clam and reverse clam,  QS, bridge    Consulted and Agree with Plan of Care  Patient       Patient will benefit from skilled therapeutic intervention in order to improve the following deficits and impairments:  Pain, Increased muscle spasms, Decreased strength, Difficulty walking, Decreased activity tolerance  Visit Diagnosis: 1. Acute pain of right knee   2. Muscle weakness (generalized)   3. Difficulty in walking, not elsewhere classified        Problem List Patient Active Problem List   Diagnosis Date Noted  . 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 Bradycardia 11/23/2016  . Hyperlipidemia 07/28/2016  . UTERINE FIBROID 10/07/2006  . OBESITY, NOS 10/07/2006    Darrel Hoover PT 02/02/2019, 11:05 AM  [facility] [address] [place], [place], [address] Phone: [phone]   Fax:  [phone]  Name: THESSALY MCCULLERS MRN: [id number] Date of Birth: January 28, 1951

## 2019-02-07 ENCOUNTER — Ambulatory Visit: Payer: PRIVATE HEALTH INSURANCE | Admitting: Physical Therapy

## 2019-02-07 DIAGNOSIS — R262 Difficulty in walking, not elsewhere classified: Secondary | ICD-10-CM

## 2019-02-07 DIAGNOSIS — M25561 Pain in right knee: Secondary | ICD-10-CM | POA: Diagnosis not present

## 2019-02-07 DIAGNOSIS — M6281 Muscle weakness (generalized): Secondary | ICD-10-CM

## 2019-02-07 NOTE — Therapy (Signed)
[place], [place], [address] Phone: [phone]   Fax:  [phone]  Physical Therapy Treatment  Patient Details  Name: Jessica Combs MRN: [id number] Date of Birth: [birth year] Referring Provider (PT): Sherrie Mustache , NP   Encounter Date: 02/07/2019  PT End of Session - 02/07/19 1251    Visit Number  6    Number of Visits  12    Date for PT Re-Evaluation  03/03/19    PT Start Time  0086    PT Stop Time  1321    PT Time Calculation (min)  32 min       Past Medical History:  Diagnosis Date  . Arthritis of knee   . Bradycardia   . Bronchitis   . Osteopenia 01/27/2017  . Vaginitis     Past Surgical History:  Procedure Laterality Date  . BREAST BIOPSY Right   . BREAST BIOPSY Right     There were no vitals filed for this visit.  Subjective Assessment - 02/07/19 1252    Subjective  No pain now. i over slept for appointment due to someone calling in at work and I had to work longer.    Currently in Pain?  No/denies    Aggravating Factors   prolonged activity on feet    Pain Relieving Factors  rest, ionto         [facility] PT Assessment - 02/07/19 0001      Strength   Right Knee Flexion  4+/5    Right Knee Extension  4+/5   no pain                   [facility] Adult PT Treatment/Exercise - 02/07/19 0001      Knee/Hip Exercises: Stretches   Sports administrator Limitations  passive x 2       Knee/Hip Exercises: Supine   Quad Sets  10 reps      Ultrasound   Ultrasound Location  Rt patella tendon    Ultrasound Parameters  50% 1.0w/cm2 , 3 mhz    Ultrasound Goals  Edema;Pain      Iontophoresis   Type of Iontophoresis  Dexamethasone    Location  right patella tendon    Dose  77ml    Time  6 hour patch      Manual Therapy   Manual therapy comments  Patella mobs all planes and soft tissue work to peripatella musculature                PT Short Term Goals - 02/02/19  1104      PT SHORT TERM GOAL #1   Title  She will be indpendent with initial HEp     Status  Achieved      PT SHORT TERM GOAL #2   Title  She will report pain decr 25% or more with walking    Status  On-going      PT SHORT TERM GOAL #3   Title  She will be able to flex and extend leg with improved smoothness due  to decr pain    Status  Achieved      PT SHORT TERM GOAL #4   Title  She will be able to do a SLR RT equal LT    Status  Achieved        PT Long Term Goals - 01/18/19 1137      PT LONG TERM GOAL #1   Title  She will be independnet with all hEP issued    Time  6    Period  Weeks    Status  New      PT LONG TERM GOAL #2   Title  She will report 1-2 max pain with work and home activity    Time  6    Period  Weeks    Status  New      PT LONG TERM GOAL #3   Title  She will report  min to no swelling or  shooting pain into lower leg    Time  6    Period  Weeks    Status  New      PT LONG TERM GOAL #4   Title  She will be able to stand from chair without hesitation and walkwithou hesitation for first 2-3 steps    Time  6    Period  Weeks    Status  New            Plan - 02/07/19 1326    Clinical Impression Statement  Pt reports she is feeling better. Can be active on feet for 3 hours prior to rest. Reports pain intensity not as high. She points to medial fat pad for pain. Performed patella mobs and soft tissue to peripatella. [place] and Ionto performed directly on patella tendon tendon. Short session due to patient being late.    PT Next Visit Plan  continue US/ionto if still helpful; Reveiw HEP , manual and modalities; add to HEP-    PT Home Exercise Plan  supine hip abduction , side clam and reverse clam,  QS, bridge    Consulted and Agree with Plan of Care  Patient       Patient will benefit from skilled therapeutic intervention in order to improve the following deficits and impairments:  Pain, Increased muscle spasms, Decreased strength, Difficulty  walking, Decreased activity tolerance  Visit Diagnosis: 1. Acute pain of right knee   2. Muscle weakness (generalized)   3. Difficulty in walking, not elsewhere classified        Problem List Patient Active Problem List   Diagnosis Date Noted  . 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 Bradycardia 11/23/2016  . Hyperlipidemia 07/28/2016  . UTERINE FIBROID 10/07/2006  . OBESITY, NOS 10/07/2006    Dorene Ar, PTA 02/07/2019, 1:29 PM  [facility] [address] [place], [place], [address] Phone: [phone]   Fax:  [phone]  Name: Jessica Combs MRN: [id number] Date of Birth: 1951-04-27

## 2019-02-09 ENCOUNTER — Other Ambulatory Visit: Payer: Self-pay

## 2019-02-09 ENCOUNTER — Ambulatory Visit: Payer: PRIVATE HEALTH INSURANCE | Attending: Nurse Practitioner

## 2019-02-09 DIAGNOSIS — M25561 Pain in right knee: Secondary | ICD-10-CM

## 2019-02-09 DIAGNOSIS — M6281 Muscle weakness (generalized): Secondary | ICD-10-CM

## 2019-02-09 DIAGNOSIS — R262 Difficulty in walking, not elsewhere classified: Secondary | ICD-10-CM

## 2019-02-09 NOTE — Therapy (Signed)
[place], [place], [address] Phone: [phone]   Fax:  [phone]  Physical Therapy Treatment  Patient Details  Name: Jessica Combs MRN: [id number] Date of Birth: November 22, 1950 Referring Provider (PT): Sherrie Mustache , NP   Encounter Date: 02/09/2019  PT End of Session - 02/09/19 1106    Visit Number  7    Number of Visits  12    Date for PT Re-Evaluation  03/03/19    Authorization Type  [employer]    PT Start Time  1104    PT Stop Time  1142    PT Time Calculation (min)  38 min    Activity Tolerance  Patient tolerated treatment well;Patient limited by pain    Behavior During Therapy  [facility] for tasks assessed/performed       Past Medical History:  Diagnosis Date  . Arthritis of knee   . Bradycardia   . Bronchitis   . Osteopenia 01/27/2017  . Vaginitis     Past Surgical History:  Procedure Laterality Date  . BREAST BIOPSY Right   . BREAST BIOPSY Right     There were no vitals filed for this visit.  Subjective Assessment - 02/09/19 1110    Subjective  no pain right now. IT  doing better  Pain now anterior medially Sometime with walking she gets clicking.   Feels this after sitting for a period. M=No medial knee pain.    Currently in Pain?  No/denies                       [facility] Adult PT Treatment/Exercise - 02/09/19 0001      Knee/Hip Exercises: Supine   Straight Leg Raises  10 reps;Right    Straight Leg Raise with External Rotation  Right;10 reps      Knee/Hip Exercises: Sidelying   Hip ABduction  Right;15 reps    Hip ADduction  Right;10 reps    Clams  15      Knee/Hip Exercises: Prone   Hamstring Curl  15 reps    Hip Extension  --    Straight Leg Raises  Right;Left;15 reps      Iontophoresis   Type of Iontophoresis  Dexamethasone    Location  right patella tendon    Dose  43ml    Time  6 hour patch      Manual Therapy   Manual therapy comments  Patella  mobs all planes and soft tissue work to peripatella musculature                PT Short Term Goals - 02/02/19 1104      PT SHORT TERM GOAL #1   Title  She will be indpendent with initial HEp     Status  Achieved      PT SHORT TERM GOAL #2   Title  She will report pain decr 25% or more with walking    Status  On-going      PT SHORT TERM GOAL #3   Title  She will be able to flex and extend leg with improved smoothness due  to decr pain    Status  Achieved      PT SHORT TERM GOAL #4   Title  She will be able to do a SLR RT equal LT    Status  Achieved        PT Long Term Goals - 02/09/19 1143  PT LONG TERM GOAL #1   Title  She will be independnet with all hEP issued    Status  On-going      PT LONG TERM GOAL #2   Title  She will report 1-2 max pain with work and home activity    Status  Achieved      PT LONG TERM GOAL #3   Title  She will report  min to no swelling or  shooting pain into lower leg    Status  Achieved      PT LONG TERM GOAL #4   Title  She will be able to stand from chair without hesitation and walk without hesitation for first 2-3 steps    Baseline  now gets click but no pain    Status  Achieved            Plan - 02/09/19 1107    Clinical Impression Statement  Much improved with intermittant RT knee pain along joint line and patella anterior. No soft tissue tenderness medial thigh and knee.  Clicking could be related to many possible causes but generally no pain with this.    PT Treatment/Interventions  Taping;Passive range of motion;Dry needling;Manual techniques;Therapeutic exercise;Patient/family education;Iontophoresis 4mg /ml Dexamethasone;Moist Heat;Ultrasound    PT Next Visit Plan  continue /ionto if still helpful; Reveiw HEP , manual and modalities; add bands to HEP    PT Home Exercise Plan  supine hip abduction , side clam and reverse clam,  QS, bridge    Consulted and Agree with Plan of Care  Patient       Patient will  benefit from skilled therapeutic intervention in order to improve the following deficits and impairments:  Pain, Increased muscle spasms, Decreased strength, Difficulty walking, Decreased activity tolerance  Visit Diagnosis: 1. Acute pain of right knee   2. Muscle weakness (generalized)   3. Difficulty in walking, not elsewhere classified        Problem List Patient Active Problem List   Diagnosis Date Noted  . 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 Bradycardia 11/23/2016  . Hyperlipidemia 07/28/2016  . UTERINE FIBROID 10/07/2006  . OBESITY, NOS 10/07/2006    Darrel Hoover  PT 02/09/2019, 11:45 AM  [facility] [address] [place], [place], [address] Phone: [phone]   Fax:  [phone]  Name: Jessica Combs MRN: [id number] Date of Birth: [birth year]

## 2019-02-09 NOTE — Patient Instructions (Signed)
Hip Abduction: Modified    Lying on right/left side with pillow between thighs, raise top leg from pillow, rotating slightly out. Repeat __15__ times per set. Do _1-2___ sets per session. Do __1__ sessions per day.  http://orth.exer.us/705   Copyright  VHI. All rights reserved.  Straight Leg Raise    Tighten stomach and slowly raise locked right leg ____ inches from floor. Repeat _10-15___ times per set. Do __1__ sets per session. Do ___1_ sessions per day.  http://orth.exer.us/1103   Copyright  VHI. All rights reserved.  HIP: Adduction - Side-Lying (Band)    Place band around leg. Lie on side with top leg crossed over banded leg. Raise bottom leg up. Hold ___ seconds. Use ________ band. ___ reps per set, ___ sets per day, ___ days per week   Copyright  VHI. All rights reserved.  Hamstrings    Lie on stomach. Bend same knee, pointing toes toward knee. Do not bend hips. Hold ____ seconds. Repeat _10-15___ times. Do __1__ sessions per day. CAUTION: Move slowly.  Copyright  VHI. All rights reserved.

## 2019-02-21 ENCOUNTER — Ambulatory Visit: Payer: PRIVATE HEALTH INSURANCE | Admitting: Physical Therapy

## 2019-02-21 ENCOUNTER — Encounter: Payer: Self-pay | Admitting: Physical Therapy

## 2019-02-21 ENCOUNTER — Other Ambulatory Visit: Payer: Self-pay

## 2019-02-21 DIAGNOSIS — M25561 Pain in right knee: Secondary | ICD-10-CM

## 2019-02-21 DIAGNOSIS — R262 Difficulty in walking, not elsewhere classified: Secondary | ICD-10-CM

## 2019-02-21 DIAGNOSIS — M6281 Muscle weakness (generalized): Secondary | ICD-10-CM

## 2019-02-21 NOTE — Therapy (Signed)
[place], [place], [address] Phone: [phone]   Fax:  [phone]  Physical Therapy Treatment  Patient Details  Name: Jessica Combs MRN: [id number] Date of Birth: [birth year] Referring Provider (PT): Sherrie Mustache , NP   Encounter Date: 02/21/2019  PT End of Session - 02/21/19 1243    Visit Number  8    Number of Visits  12    Date for PT Re-Evaluation  03/03/19    PT Start Time  9470    PT Stop Time  1313    PT Time Calculation (min)  38 min       Past Medical History:  Diagnosis Date  . Arthritis of knee   . Bradycardia   . Bronchitis   . Osteopenia 01/27/2017  . Vaginitis     Past Surgical History:  Procedure Laterality Date  . BREAST BIOPSY Right   . BREAST BIOPSY Right     There were no vitals filed for this visit.  Subjective Assessment - 02/21/19 1240    Subjective  A little pain at anterior knee.    Currently in Pain?  Yes    Pain Score  5     Pain Location  Knee    Pain Orientation  Right    Pain Descriptors / Indicators  Aching    Pain Type  Chronic pain    Aggravating Factors   random, working with heavy patient    Pain Relieving Factors  rest, PT, ionto                       [facility] Adult PT Treatment/Exercise - 02/21/19 0001      Knee/Hip Exercises: Stretches   Active Hamstring Stretch Limitations  seated EOM x 3       Knee/Hip Exercises: Aerobic   Nustep  L4 x 5 minutes       Knee/Hip Exercises: Seated   Long Arc Quad  20 reps   2 sets    Long Arc Quad Weight  3 lbs.    Hamstring Curl  20 reps    Hamstring Limitations  green      Knee/Hip Exercises: Supine   Short Arc Quad Sets Limitations  SAQ with ball squeeze right x 20     Straight Leg Raises  10 reps;Right    Straight Leg Raise with External Rotation  Right;10 reps      Knee/Hip Exercises: Sidelying   Hip ABduction  20 reps    Clams  x 20     Other Sidelying Knee/Hip  Exercises  reverse clam x 20       Knee/Hip Exercises: Prone   Hamstring Curl  20 reps    Straight Leg Raises  Right;20 reps      Ultrasound   Ultrasound Location  --    Ultrasound Goals  --      Iontophoresis   Type of Iontophoresis  Dexamethasone    Location  right patella tendon    Dose  68ml    Time  6 hour patch      Manual Therapy   Manual therapy comments  Patella mobs all planes and soft tissue work to peripatella musculature                PT Short Term Goals - 02/02/19 1104      PT SHORT TERM GOAL #1   Title  She will be indpendent with initial HEp  Status  Achieved      PT SHORT TERM GOAL #2   Title  She will report pain decr 25% or more with walking    Status  On-going      PT SHORT TERM GOAL #3   Title  She will be able to flex and extend leg with improved smoothness due  to decr pain    Status  Achieved      PT SHORT TERM GOAL #4   Title  She will be able to do a SLR RT equal LT    Status  Achieved        PT Long Term Goals - 02/09/19 1143      PT LONG TERM GOAL #1   Title  She will be independnet with all hEP issued    Status  On-going      PT LONG TERM GOAL #2   Title  She will report 1-2 max pain with work and home activity    Status  Achieved      PT LONG TERM GOAL #3   Title  She will report  min to no swelling or  shooting pain into lower leg    Status  Achieved      PT LONG TERM GOAL #4   Title  She will be able to stand from chair without hesitation and walk without hesitation for first 2-3 steps    Baseline  now gets click but no pain    Status  Achieved            Plan - 02/21/19 1314    Clinical Impression Statement  Pt reports overall pain in right knee much improved. 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 Otherwise she reports pain is normally manageable. Reviewed Exercises and repeated manual and ionto to reduce pain.    PT Next Visit Plan  Likely Dc next visit, FOTO  if needed    PT Home Exercise Plan  supine hip abduction , side clam and reverse clam,  QS, bridge       Patient will benefit from skilled therapeutic intervention in order to improve the following deficits and impairments:  Pain, Increased muscle spasms, Decreased strength, Difficulty walking, Decreased activity tolerance  Visit Diagnosis: 1. Acute pain of right knee   2. Muscle weakness (generalized)   3. Difficulty in walking, not elsewhere classified        Problem List Patient Active Problem List   Diagnosis Date Noted  . 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 Bradycardia 11/23/2016  . Hyperlipidemia 07/28/2016  . UTERINE FIBROID 10/07/2006  . OBESITY, NOS 10/07/2006    Dorene Ar, PTA 02/21/2019, 1:17 PM  [facility] [address] [place], [place], [address] Phone: [phone]   Fax:  [phone]  Name: CELITA ARON MRN: [id number] Date of Birth: 1951/02/27

## 2019-02-22 ENCOUNTER — Ambulatory Visit: Payer: PRIVATE HEALTH INSURANCE

## 2019-02-22 DIAGNOSIS — R262 Difficulty in walking, not elsewhere classified: Secondary | ICD-10-CM

## 2019-02-22 DIAGNOSIS — M6281 Muscle weakness (generalized): Secondary | ICD-10-CM

## 2019-02-22 DIAGNOSIS — M25561 Pain in right knee: Secondary | ICD-10-CM

## 2019-02-22 NOTE — Therapy (Signed)
[place], [place], [address] Phone: [phone]   Fax:  [phone]  Physical Therapy Treatment/Discharge  Patient Details  Name: Jessica Combs MRN: [id number] Date of Birth: 02-18-1951 Referring Provider (PT): Sherrie Mustache , NP   Encounter Date: 02/22/2019  PT End of Session - 02/22/19 1018    Visit Number  9    Number of Visits  12    Date for PT Re-Evaluation  03/03/19    Authorization Type  [employer]    PT Start Time  1017    PT Stop Time  1100    PT Time Calculation (min)  43 min    Activity Tolerance  Patient tolerated treatment well;Patient limited by pain    Behavior During Therapy  [facility] for tasks assessed/performed       Past Medical History:  Diagnosis Date  . Arthritis of knee   . Bradycardia   . Bronchitis   . Osteopenia 01/27/2017  . Vaginitis     Past Surgical History:  Procedure Laterality Date  . BREAST BIOPSY Right   . BREAST BIOPSY Right     There were no vitals filed for this visit.  Subjective Assessment - 02/22/19 1024    Subjective  LBP makes her not feel to good today. Knee pain over nite used some meds  with 5/10 pain today located patella tendon.    Pain Score  5     Pain Location  Knee    Pain Orientation  Right    Pain Descriptors / Indicators  Aching    Pain Type  Chronic pain    Pain Onset  More than a month ago    Pain Frequency  Intermittent    Aggravating Factors   working with heavy patient.    Pain Relieving Factors  rest, Ionto         [facility] PT Assessment - 02/22/19 0001      Observation/Other Assessments   Focus on Therapeutic Outcomes (FOTO)   35% limited                   [facility] Adult PT Treatment/Exercise - 02/22/19 0001      Knee/Hip Exercises: Aerobic   Nustep  L3 5 min LE only      Knee/Hip Exercises: Seated   Long Arc Quad  --    Long Arc Quad Weight  3 lbs.    Long [employer] Limitations  40 reps RT/LT    Hamstring Curl  20 reps    Hamstring Limitations  green      Knee/Hip Exercises: Supine   Short Arc [employer]  Both;20 reps    Short Arc Quad Sets Limitations  ball squeeze    Straight Leg Raises  15 reps;Right    Straight Leg Raise with External Rotation  Right;10 reps    Other Supine Knee/Hip Exercises  glute sets with short lift legs on olster x20      Knee/Hip Exercises: Sidelying   Hip ABduction  15 reps    Clams  x 20     Other Sidelying Knee/Hip Exercises  reverse clam x 20              PT Education - 02/22/19 1104    Education Details  HEP.    Person(s) Educated  Patient    Methods  Explanation;Demonstration;Tactile cues;Verbal cues;Handout    Comprehension  Returned demonstration;Verbalized understanding       PT Short Term  Goals - 02/02/19 1104      PT SHORT TERM GOAL #1   Title  She will be indpendent with initial HEp     Status  Achieved      PT SHORT TERM GOAL #2   Title  She will report pain decr 25% or more with walking    Status  On-going      PT SHORT TERM GOAL #3   Title  She will be able to flex and extend leg with improved smoothness due  to decr pain    Status  Achieved      PT SHORT TERM GOAL #4   Title  She will be able to do a SLR RT equal LT    Status  Achieved        PT Long Term Goals - 02/22/19 1109      PT LONG TERM GOAL #1   Title  She will be independnet with all hEP issued    Status  Achieved      PT LONG TERM GOAL #2   Title  She will report 1-2 max pain with work and home activity    Baseline  She acheived this and with increased work load her pain increased to 5/10 this week    Status  Partially Met      PT LONG TERM GOAL #3   Title  She will report  min to no swelling or  shooting pain into lower leg    Baseline  No shooting but continued swelling anterior distal medial knee RT    Status  Partially Met      PT LONG TERM GOAL #4   Title  She will be able to stand from chair without hesitation and walk without  hesitation for first 2-3 steps    Baseline  now gets click but no pain prior to this week    Status  Partially Met      PT LONG TERM GOAL #5   Title  FOTO score decr to 40% limited or better    Baseline  she is at 35% limited today from 50% limited    Status  Achieved            Plan - 02/22/19 1019    Clinical Impression Statement  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 Her back hurts also. She is trying to have a change to not assist this individual. She agreed to discharge and I asked her to see how she felt in 4-6 weeks if work load returns to normal .If she is better she should continue the HEP and if not she can ask MD to refer her back. She is concerned about continued swelling anterior medial knee.    PT Treatment/Interventions  Taping;Passive range of motion;Dry needling;Manual techniques;Therapeutic exercise;Patient/family education;Iontophoresis 60m/ml Dexamethasone;Moist Heat;Ultrasound    PT Next Visit Plan  Discharge today    PT Home Exercise Plan  supine hip abduction , side clam and reverse clam,  QS, bridge, red band quad and hamstring strengthening    Consulted and Agree with Plan of Care  Patient       Patient will benefit from skilled therapeutic intervention in order to improve the following deficits and impairments:  Pain, Increased muscle spasms, Decreased strength, Difficulty walking, Decreased activity tolerance  Visit Diagnosis: 1. Muscle weakness (generalized)   2. Difficulty in walking, not elsewhere classified   3. Acute pain  of right knee        Problem List Patient Active Problem List   Diagnosis Date Noted  . 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 Bradycardia 11/23/2016  . Hyperlipidemia 07/28/2016  . UTERINE FIBROID 10/07/2006  . OBESITY, NOS 10/07/2006    Darrel Hoover  PT 02/22/2019, 11:12 AM  [facility] [address] [place], [place], [address] Phone: [phone]   Fax:  [phone]  Name: Jessica Combs MRN: [id number] Date of Birth: 1951/04/26  PHYSICAL THERAPY DISCHARGE SUMMARY  Visits from Start of Care: 9  Current functional level related to goals / functional outcomes: See above  Remaining deficits: See above   Education / Equipment: HEP Plan: Patient agrees to discharge.  Patient goals were not met. Patient is being discharged due to being pleased with the current functional level.  ?????

## 2019-02-22 NOTE — Patient Instructions (Signed)
Quad and hamstring strength with red theraband x 20 reps daily RT/LT

## 2019-02-23 ENCOUNTER — Ambulatory Visit: Payer: PRIVATE HEALTH INSURANCE

## 2019-03-28 ENCOUNTER — Other Ambulatory Visit: Payer: Self-pay | Admitting: Internal Medicine

## 2019-03-28 ENCOUNTER — Other Ambulatory Visit: Payer: Self-pay

## 2019-03-28 ENCOUNTER — Ambulatory Visit
Admission: RE | Admit: 2019-03-28 | Discharge: 2019-03-28 | Disposition: A | Discharge status: Home / Self Care | Payer: PRIVATE HEALTH INSURANCE | Source: Ambulatory Visit | Attending: Internal Medicine | Admitting: Internal Medicine

## 2019-03-28 DIAGNOSIS — Z1231 Encounter for screening mammogram for malignant neoplasm of breast: Secondary | ICD-10-CM

## 2019-03-28 DIAGNOSIS — M25561 Pain in right knee: Secondary | ICD-10-CM

## 2019-03-31 ENCOUNTER — Other Ambulatory Visit: Payer: Self-pay | Admitting: Internal Medicine

## 2019-03-31 DIAGNOSIS — M7989 Other specified soft tissue disorders: Secondary | ICD-10-CM

## 2019-04-06 ENCOUNTER — Ambulatory Visit
Admission: RE | Admit: 2019-04-06 | Discharge: 2019-04-06 | Disposition: A | Discharge status: Home / Self Care | Payer: PRIVATE HEALTH INSURANCE | Source: Ambulatory Visit | Attending: Internal Medicine | Admitting: Internal Medicine

## 2019-04-06 DIAGNOSIS — M7989 Other specified soft tissue disorders: Secondary | ICD-10-CM

## 2019-05-12 ENCOUNTER — Ambulatory Visit: Payer: PRIVATE HEALTH INSURANCE

## 2019-06-23 ENCOUNTER — Ambulatory Visit
Admission: RE | Admit: 2019-06-23 | Discharge: 2019-06-23 | Disposition: A | Discharge status: Home / Self Care | Payer: PRIVATE HEALTH INSURANCE | Source: Ambulatory Visit | Attending: Internal Medicine | Admitting: Internal Medicine

## 2019-06-23 ENCOUNTER — Other Ambulatory Visit: Payer: Self-pay

## 2019-06-23 DIAGNOSIS — Z1231 Encounter for screening mammogram for malignant neoplasm of breast: Secondary | ICD-10-CM

## 2019-12-07 IMAGING — CR RIGHT KNEE - COMPLETE 4+ VIEW
4 series · 4 of 4 positions shown · non-contrast
Comparison: None.

CLINICAL DATA: Right knee pain and swelling for 2 months without
known injury.

EXAM:
RIGHT KNEE - COMPLETE 4+ VIEW

[t knee ap right]
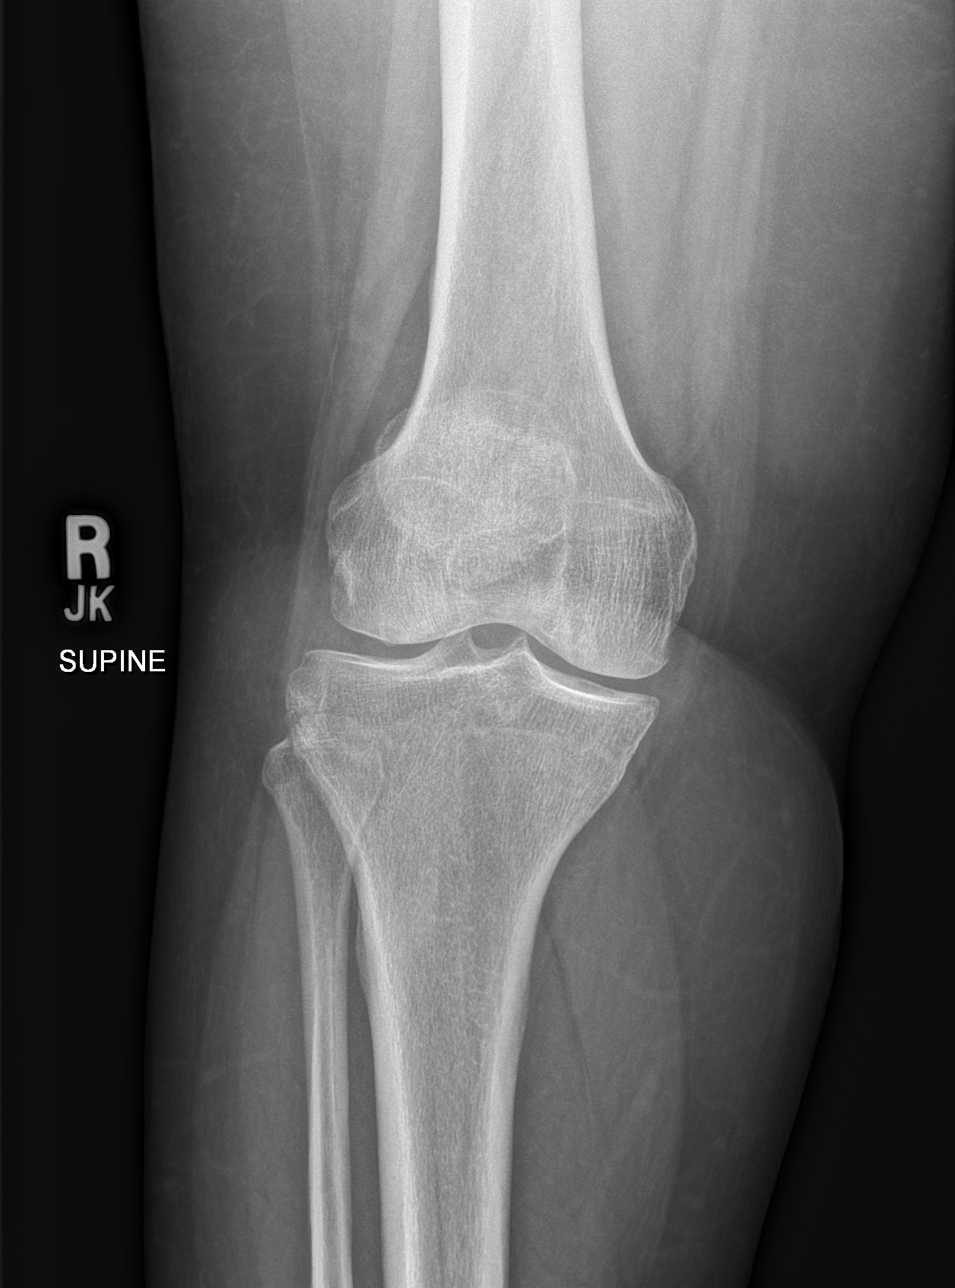

[t knee obl right]
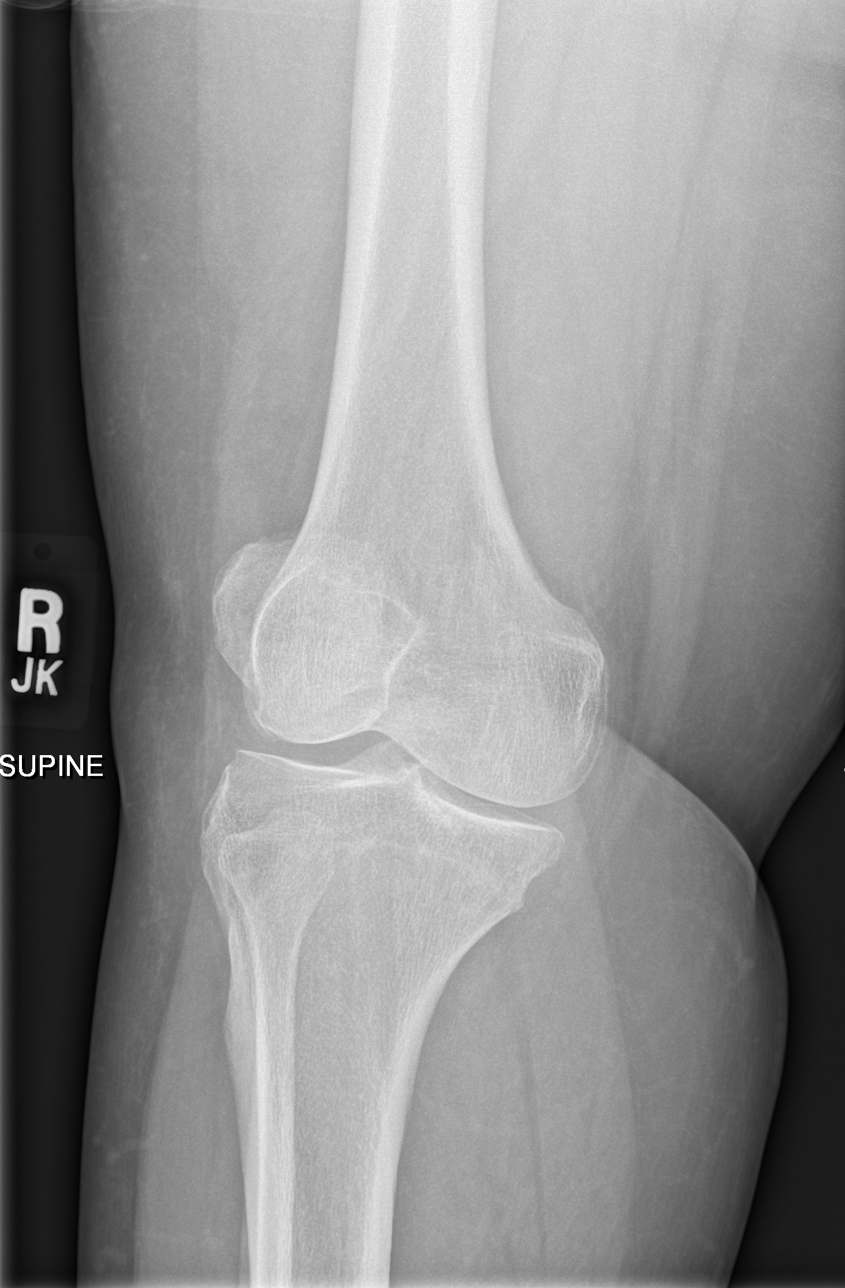

[t knee lat right (1 of 2)]
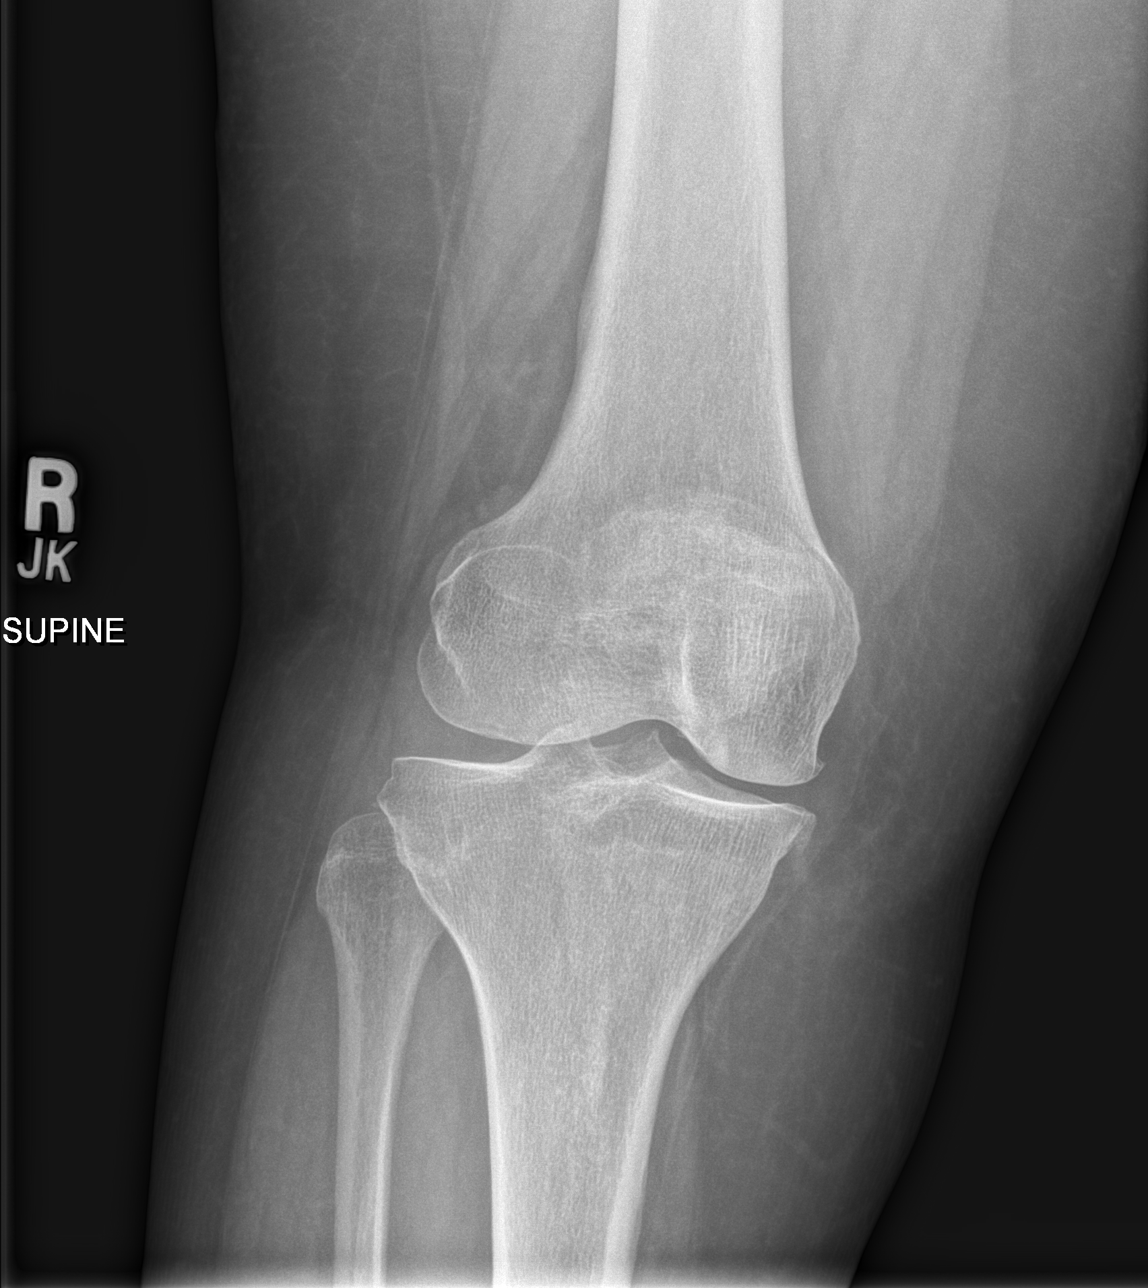

[t knee lat right (2 of 2)]
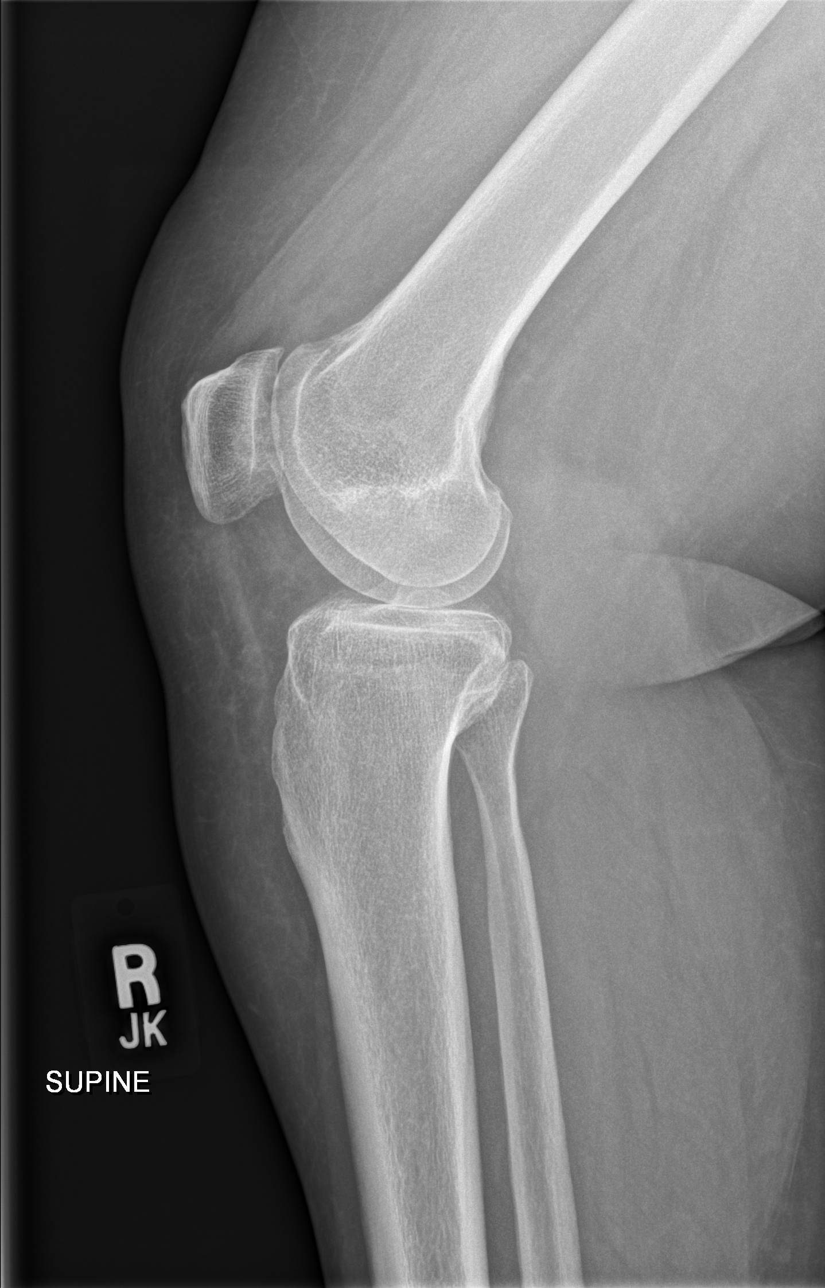

[4 of 4 positions shown; findings below may reference images not displayed]

FINDINGS: No evidence of fracture, dislocation, or joint effusion. No evidence
of arthropathy or other focal bone abnormality. Soft tissues are
unremarkable.
IMPRESSION: Negative.

## 2020-01-18 ENCOUNTER — Other Ambulatory Visit: Payer: Self-pay | Admitting: Internal Medicine

## 2020-01-18 DIAGNOSIS — Z13820 Encounter for screening for osteoporosis: Secondary | ICD-10-CM

## 2020-02-13 ENCOUNTER — Other Ambulatory Visit: Payer: PRIVATE HEALTH INSURANCE

## 2020-04-18 ENCOUNTER — Ambulatory Visit
Admission: RE | Admit: 2020-04-18 | Discharge: 2020-04-18 | Disposition: A | Discharge status: Home / Self Care | Payer: PRIVATE HEALTH INSURANCE | Source: Ambulatory Visit | Attending: Internal Medicine | Admitting: Internal Medicine

## 2020-04-18 ENCOUNTER — Other Ambulatory Visit: Payer: Self-pay

## 2020-04-18 DIAGNOSIS — Z13820 Encounter for screening for osteoporosis: Secondary | ICD-10-CM

## 2020-06-05 ENCOUNTER — Other Ambulatory Visit: Payer: Self-pay | Admitting: Internal Medicine

## 2020-06-05 DIAGNOSIS — Z1231 Encounter for screening mammogram for malignant neoplasm of breast: Secondary | ICD-10-CM

## 2020-07-16 ENCOUNTER — Ambulatory Visit
Admission: RE | Admit: 2020-07-16 | Discharge: 2020-07-16 | Disposition: A | Discharge status: Home / Self Care | Payer: PRIVATE HEALTH INSURANCE | Source: Ambulatory Visit | Attending: Internal Medicine | Admitting: Internal Medicine

## 2020-07-16 ENCOUNTER — Other Ambulatory Visit: Payer: Self-pay

## 2020-07-16 DIAGNOSIS — Z1231 Encounter for screening mammogram for malignant neoplasm of breast: Secondary | ICD-10-CM

## 2021-06-16 ENCOUNTER — Other Ambulatory Visit: Payer: Self-pay | Admitting: Internal Medicine

## 2021-06-16 DIAGNOSIS — Z1231 Encounter for screening mammogram for malignant neoplasm of breast: Secondary | ICD-10-CM

## 2021-07-23 ENCOUNTER — Ambulatory Visit
Admission: RE | Admit: 2021-07-23 | Discharge: 2021-07-23 | Disposition: A | Discharge status: Home / Self Care | Payer: No Typology Code available for payment source | Source: Ambulatory Visit | Attending: Internal Medicine | Admitting: Internal Medicine

## 2021-07-23 DIAGNOSIS — Z1231 Encounter for screening mammogram for malignant neoplasm of breast: Secondary | ICD-10-CM

## 2022-03-19 ENCOUNTER — Other Ambulatory Visit: Payer: Self-pay | Admitting: Internal Medicine

## 2022-03-19 DIAGNOSIS — Z1231 Encounter for screening mammogram for malignant neoplasm of breast: Secondary | ICD-10-CM

## 2022-03-20 ENCOUNTER — Telehealth: Payer: Self-pay | Admitting: Oncology

## 2022-03-20 NOTE — Telephone Encounter (Signed)
Scheduled appt per 8/10 referral. Pt is aware of appt date and time. Pt is aware to arrive 15 mins prior to appt time and to bring and updated insurance card. Pt is aware of appt location.   

## 2022-04-09 ENCOUNTER — Telehealth: Payer: Self-pay | Admitting: Oncology

## 2022-04-09 NOTE — Telephone Encounter (Signed)
R/s pt's new hem appt per provider request. Pt is aware of new appt date/time.

## 2022-04-14 ENCOUNTER — Inpatient Hospital Stay: Payer: No Typology Code available for payment source | Admitting: Oncology

## 2022-04-20 ENCOUNTER — Telehealth: Payer: Self-pay | Admitting: Physician Assistant

## 2022-04-20 NOTE — Telephone Encounter (Signed)
R/s pt's new hem appt per pt request. Pt is aware of new appt date/time.  

## 2022-04-24 ENCOUNTER — Inpatient Hospital Stay: Payer: No Typology Code available for payment source | Admitting: Oncology

## 2022-05-13 ENCOUNTER — Inpatient Hospital Stay: Payer: Medicare Other | Attending: Oncology | Admitting: Physician Assistant

## 2022-05-13 ENCOUNTER — Inpatient Hospital Stay: Payer: Medicare Other

## 2022-05-13 ENCOUNTER — Other Ambulatory Visit: Payer: Self-pay

## 2022-05-13 ENCOUNTER — Encounter: Payer: Self-pay | Admitting: Physician Assistant

## 2022-05-13 VITALS — BP 150/81 | HR 65 | Temp 97.5°F | Resp 18 | Ht 62.0 in | Wt 221.8 lb

## 2022-05-13 DIAGNOSIS — E785 Hyperlipidemia, unspecified: Secondary | ICD-10-CM | POA: Diagnosis not present

## 2022-05-13 DIAGNOSIS — D696 Thrombocytopenia, unspecified: Secondary | ICD-10-CM

## 2022-05-13 DIAGNOSIS — D709 Neutropenia, unspecified: Secondary | ICD-10-CM | POA: Diagnosis not present

## 2022-05-13 DIAGNOSIS — D708 Other neutropenia: Secondary | ICD-10-CM

## 2022-05-13 DIAGNOSIS — Z79899 Other long term (current) drug therapy: Secondary | ICD-10-CM | POA: Diagnosis not present

## 2022-05-13 DIAGNOSIS — Z7289 Other problems related to lifestyle: Secondary | ICD-10-CM

## 2022-05-13 DIAGNOSIS — Z114 Encounter for screening for human immunodeficiency virus [HIV]: Secondary | ICD-10-CM

## 2022-05-13 LAB — CMP (CANCER CENTER ONLY)
ALT: 19 U/L (ref 0–44)
AST: 24 U/L (ref 15–41)
Albumin: 3.9 g/dL (ref 3.5–5.0)
Alkaline Phosphatase: 54 U/L (ref 38–126)
Anion gap: 5 (ref 5–15)
BUN: 19 mg/dL (ref 8–23)
CO2: 26 mmol/L (ref 22–32)
Calcium: 9.1 mg/dL (ref 8.9–10.3)
Chloride: 109 mmol/L (ref 98–111)
Creatinine: 0.81 mg/dL (ref 0.44–1.00)
GFR, Estimated: >60 mL/min (ref >60)
Glucose, Bld: 86 mg/dL (ref 70–99)
Potassium: 3.6 mmol/L (ref 3.5–5.1)
Sodium: 140 mmol/L (ref 135–145)
Total Bilirubin: 0.9 mg/dL (ref 0.3–1.2)
Total Protein: 7.2 g/dL (ref 6.5–8.1)

## 2022-05-13 LAB — IMMATURE PLATELET FRACTION: Immature Platelet Fraction: 8.5 % (ref 1.2–8.6)

## 2022-05-13 LAB — HEPATITIS C ANTIBODY: HCV Ab: NONREACTIVE

## 2022-05-13 LAB — CBC (CANCER CENTER ONLY)
HCT: 37.6 % (ref 36.0–46.0)
Hemoglobin: 12.5 g/dL (ref 12.0–15.0)
MCH: 30.3 pg (ref 26.0–34.0)
MCHC: 33.2 g/dL (ref 30.0–36.0)
MCV: 91.3 fL (ref 80.0–100.0)
Platelet Count: 170 10*3/uL (ref 150–400)
RBC: 4.12 MIL/uL (ref 3.87–5.11)
RDW: 13.8 % (ref 11.5–15.5)
WBC Count: 3.5 10*3/uL — ABNORMAL LOW (ref 4.0–10.5)
nRBC: 0 % (ref 0.0–0.2)

## 2022-05-13 LAB — HEPATITIS B SURFACE ANTIBODY,QUALITATIVE: Hep B S Ab: NONREACTIVE

## 2022-05-13 LAB — DIFFERENTIAL
Abs Immature Granulocytes: 0.01 10*3/uL (ref 0.00–0.07)
Basophils Absolute: 0 10*3/uL (ref 0.0–0.1)
Basophils Relative: 1 %
Eosinophils Absolute: 0.1 10*3/uL (ref 0.0–0.5)
Eosinophils Relative: 2 %
Immature Granulocytes: 0 %
Lymphocytes Relative: 47 %
Lymphs Abs: 1.6 10*3/uL (ref 0.7–4.0)
Monocytes Absolute: 0.3 10*3/uL (ref 0.1–1.0)
Monocytes Relative: 8 %
Neutro Abs: 1.5 10*3/uL — ABNORMAL LOW (ref 1.7–7.7)
Neutrophils Relative %: 42 %

## 2022-05-13 LAB — VITAMIN B12: Vitamin B-12: 393 pg/mL (ref 180–914)

## 2022-05-13 LAB — HIV ANTIBODY (ROUTINE TESTING W REFLEX): HIV Screen 4th Generation wRfx: NONREACTIVE

## 2022-05-13 NOTE — Progress Notes (Signed)
[facility] [phone]   Fax:([phone] [place] NOTE  Patient Care Team: Audley Hose, MD as PCP - General (Internal Medicine)  CHIEF COMPLAINTS/PURPOSE OF CONSULTATION:  Thrombocytopenia Neutropenia  HISTORY OF PRESENTING ILLNESS:  Maribella O Pletz 71 y.o. female with medical history significant for hyperlipidemia presents to the hematology clinic for evaluation for thrombocytopenia and neutropenia.  He is unaccompanied for this visit.  On review of the previous records, there is evidence of longstanding neutropenia as far back as 2016.  Most recent labs from 03/10/2022 show white blood cell count 2.7, hemoglobin 12.9, platelet 133 K, absolute neutrophil count 1.1.  On exam today, Ms. Sweetser reports that she is feeling well without any significant limitations.  Her energy levels are stable and she is able to complete all her daily activities on her own.  She has a good appetite and denies any weight loss.  She denies easy bruising or signs of active bleeding.  She denies any GI symptoms including nausea, vomiting, diarrhea or constipation.  She denies fevers, chills, night sweats, shortness of breath, chest pain or cough.  She has no other complaints.  Rest of 10 point ROS is below.  MEDICAL HISTORY:  Past Medical History:  Diagnosis Date   Arthritis of knee    Bradycardia    Bronchitis    Osteopenia 01/27/2017   Vaginitis     SURGICAL HISTORY: Past Surgical History:  Procedure Laterality Date   BREAST BIOPSY Right     SOCIAL HISTORY: Social History   Socioeconomic History   Marital status: Married    Spouse name: Not on file   Number of children: Not on file   Years of education: Not on file   Highest education level: Not on file  Occupational History   Not on file  Tobacco Use   Smoking status: Never   Smokeless tobacco: Never  Vaping Use   Vaping Use: Never used  Substance and Sexual Activity   Alcohol  use: No   Drug use: No   Sexual activity: Yes    Partners: Male    Birth control/protection: Post-menopausal  Other Topics Concern   Not on file  Social History Narrative   Diet: Regular      Do you drink/ eat things with caffeine?Yes ,In small ammounts      Marital status: Married                              What year were you married ? 1976      Do you live in a house, apartment,assistred living, condo, trailer, etc.)? House      Is it one or more stories? One story      How many persons live in your home ?  2 people      Do you have any pets in your home ?(please list) No      Current or past profession: Certified Nursing Assistant      Do you exercise?  Yes                            Type & how often: Walking ( every day)      Do you have a living will? No      Do you have a DNR form?  No  If not, do you want to discuss one? Yes      Do you have signed POA?HPOA forms?  No               If so, please bring to your        appointment      Social Determinants of Health   Financial Resource Strain: Not on file  Food Insecurity: Not on file  Transportation Needs: Not on file  Physical Activity: Not on file  Stress: Not on file  Social Connections: Not on file  Intimate Partner Violence: Not on file    FAMILY HISTORY: History reviewed. No pertinent family history.  ALLERGIES:  is allergic to pravastatin.  MEDICATIONS:  Current Outpatient Medications  Medication Sig Dispense Refill   atorvastatin (LIPITOR) 10 MG tablet TAKE 1/2 (ONE-HALF) TABLET BY MOUTH ONCE DAILY AT SUPPER (Patient not taking: Reported on 05/13/2022) 45 tablet 0   Calcium Carb-Cholecalciferol 600-10 MG-MCG TABS Take 1 tablet twice a day by oral route for 90 days. (Patient not taking: Reported on 05/13/2022)     diclofenac Sodium (VOLTAREN) 1 % GEL APPLY 2 GRAMS TO THE AFFECTED AREA(S) BY TOPICAL ROUTE 4 TIMES PER DAY (Patient not taking: Reported on 05/13/2022)     fluticasone  (FLONASE ALLERGY RELIEF) 50 MCG/ACT nasal spray Spray 1 spray every day by intranasal route. (Patient not taking: Reported on 05/13/2022)     ibuprofen (ADVIL) 400 MG tablet Take 400 mg by mouth as needed. (Patient not taking: Reported on 05/13/2022)     No current facility-administered medications for this visit.    REVIEW OF SYSTEMS:   Constitutional: ( - ) fevers, ( - )  chills , ( - ) night sweats Eyes: ( - ) blurriness of vision, ( - ) double vision, ( - ) watery eyes Ears, nose, mouth, throat, and face: ( - ) mucositis, ( - ) sore throat Respiratory: ( - ) cough, ( - ) dyspnea, ( - ) wheezes Cardiovascular: ( - ) palpitation, ( - ) chest discomfort, ( - ) lower extremity swelling Gastrointestinal:  ( - ) nausea, ( - ) heartburn, ( - ) change in bowel habits Skin: ( - ) abnormal skin rashes Lymphatics: ( - ) new lymphadenopathy, ( - ) easy bruising Neurological: ( - ) numbness, ( - ) tingling, ( - ) new weaknesses Behavioral/Psych: ( - ) mood change, ( - ) new changes  All other systems were reviewed with the patient and are negative.  PHYSICAL EXAMINATION: ECOG PERFORMANCE STATUS: 0 - Asymptomatic  Vitals:   05/13/22 1418  BP: (!) 150/81  Pulse: 65  Resp: 18  Temp: (!) 97.5 F (36.4 C)  SpO2: 99%   Filed Weights   05/13/22 1418  Weight: 221 lb 12.8 oz (100.6 kg)    GENERAL: well appearing female in NAD  SKIN: skin color, texture, turgor are normal, no rashes or significant lesions EYES: conjunctiva are pink and non-injected, sclera clear OROPHARYNX: no exudate, no erythema; lips, buccal mucosa, and tongue normal  NECK: supple, non-tender LYMPH:  no palpable lymphadenopathy in the cervical or supraclavicular lymph nodes.  LUNGS: clear to auscultation and percussion with normal breathing effort HEART: regular rate & rhythm and no murmurs and no lower extremity edema Musculoskeletal: no cyanosis of digits and no clubbing  PSYCH: alert & oriented x 3, fluent  speech NEURO: no focal motor/sensory deficits  LABORATORY DATA:  I have reviewed the data as listed    Latest  Ref Rng & Units 01/09/2019    8:56 AM 06/15/2018   11:00 AM 06/14/2017   11:48 AM  CBC  WBC 3.8 - 10.8 Thousand/uL 2.9  2.7  3.1   Hemoglobin 11.7 - 15.5 g/dL 12.8  12.5  12.9   Hematocrit 35.0 - 45.0 % 39.0  37.5  38.5   Platelets 140 - 400 Thousand/uL 145  145  157        Latest Ref Rng & Units 01/09/2019    8:56 AM 06/15/2018   11:00 AM 12/13/2017   10:44 AM  CMP  Glucose 65 - 99 mg/dL 96  93  88   BUN 7 - 25 mg/dL '13  15  17   ' Creatinine 0.50 - 0.99 mg/dL 0.80  0.75  0.87   Sodium 135 - 146 mmol/L 143  144  143   Potassium 3.5 - 5.3 mmol/L 4.1  4.0  3.9   Chloride 98 - 110 mmol/L 109  109  111   CO2 20 - 32 mmol/L '27  28  28   ' Calcium 8.6 - 10.4 mg/dL 9.5  8.9  9.0   Total Protein 6.1 - 8.1 g/dL 6.9  6.5  6.4   Total Bilirubin 0.2 - 1.2 mg/dL 0.5  0.5  0.5   AST 10 - 35 U/L '18  20  18   ' ALT 6 - 29 U/L '13  15  13    ' ASSESSMENT & PLAN JAZSMINE MACARI is a 71 y.o. female who presents to the clinic for evaluation for thrombocytopenia and neutropenia. I reviewed potential etiologies including liver disease, splenomegaly, infectious processes, nutritional anemias, immune mediated and bone marrow disorders. Patient will proceed with labs today to check CBC, CMP, Vitamin B12, MMA, SPEP/IFE, sFLC, Hepatitis B and C serologies, HIV serology, and immature platelet fraction.    #Thrombocytopenia/Neutropenia: --Labs today to check CBC w/diff, CMP, B12 level, MMA, immature platelet fraction, SPEP/IFE, sFLC, Hep B and C serologies, HIV serology. --Folate levels from August 2023 was normal.  --Evaluate for liver disease and/or splenomegaly with abdominal US --RTC based on above workup.    Orders Placed This Encounter  Procedures   CBC with Differential ([facility] Only)    Standing Status:   Future    Number of Occurrences:   1    Standing Expiration Date:   05/14/2023    Multiple Myeloma Panel (SPEP&IFE w/QIG)    Standing Status:   Future    Number of Occurrences:   1    Standing Expiration Date:   05/13/2023   Kappa/lambda light chains    Standing Status:   Future    Number of Occurrences:   1    Standing Expiration Date:   05/13/2023   Hepatitis B surface antigen    Standing Status:   Future    Number of Occurrences:   1    Standing Expiration Date:   05/13/2023    All questions were answered. The patient knows to call the clinic with any problems, questions or concerns.  I have spent a total of 60 minutes minutes of face-to-face and non-face-to-face time, preparing to see the patient, obtaining and/or reviewing separately obtained history, performing a medically appropriate examination, counseling and educating the patient, ordering tests/procedures, documenting clinical information in the electronic health record, and care coordination.   Dede Query, PA-C Department of Hematology/Oncology [place] at [facility] Phone: [phone]  Patient was seen with Dr. Lorenso Courier  I have read the  above note and personally examined the patient. I agree with the assessment and plan as noted above.  Briefly Mrs. Gita Dilger is a 71 year old African-American female who presents for evaluation of leukopenia and thrombocytopenia.  At this time suspect thrombocytopenia is a transient etiology.  However her leukopenia appears to be chronic dating back to at least 2016.  Patient has not been prone to increased infections such as sinus infections, pneumonias, or urinary tract infections.  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  The patient voiced understanding of the plan moving forward.   Ledell Peoples, MD Department of Hematology/Oncology [place] at [facility] Phone: [phone] Pager: [phone] Email: [email or web address]

## 2022-05-14 LAB — KAPPA/LAMBDA LIGHT CHAINS
Kappa free light chain: 17.9 mg/L (ref 3.3–19.4)
Kappa, lambda light chain ratio: 1.26 (ref 0.26–1.65)
Lambda free light chains: 14.2 mg/L (ref 5.7–26.3)

## 2022-05-15 LAB — HEPATITIS B SURFACE ANTIGEN: Hepatitis B Surface Ag: REACTIVE — AB

## 2022-05-16 LAB — METHYLMALONIC ACID, SERUM: Methylmalonic Acid, Quantitative: 156 nmol/L (ref 0–378)

## 2022-05-20 LAB — MULTIPLE MYELOMA PANEL, SERUM
Albumin SerPl Elph-Mcnc: 3.6 g/dL (ref 2.9–4.4)
Albumin/Glob SerPl: 1.2 (ref 0.7–1.7)
Alpha 1: 0.2 g/dL (ref 0.0–0.4)
Alpha2 Glob SerPl Elph-Mcnc: 0.7 g/dL (ref 0.4–1.0)
B-Globulin SerPl Elph-Mcnc: 0.9 g/dL (ref 0.7–1.3)
Gamma Glob SerPl Elph-Mcnc: 1.2 g/dL (ref 0.4–1.8)
Globulin, Total: 3.1 g/dL (ref 2.2–3.9)
IgA: 192 mg/dL (ref 87–352)
IgG (Immunoglobin G), Serum: 1223 mg/dL (ref 586–1602)
IgM (Immunoglobulin M), Srm: 60 mg/dL (ref 26–217)
Total Protein ELP: 6.7 g/dL (ref 6.0–8.5)

## 2022-05-26 ENCOUNTER — Telehealth: Payer: Self-pay | Admitting: Physician Assistant

## 2022-05-26 ENCOUNTER — Telehealth: Payer: Self-pay

## 2022-05-26 ENCOUNTER — Encounter: Payer: Self-pay | Admitting: Internal Medicine

## 2022-05-26 NOTE — Telephone Encounter (Signed)
Referral from Dr Lorenso Courier at [facility]. Concerns for Hepatitis B infection and asking for an appointment soon. Contacted the patient. Agrees to an appointment 06/04/22 at 11:10 am. Active My Chart. Information will be sent to her.

## 2022-05-26 NOTE — Telephone Encounter (Signed)
Per 10/17 secure chat called and spoke to pt about appointment

## 2022-05-26 NOTE — Telephone Encounter (Signed)
I called Ms. Particia Combs to review the lab results from 05/13/2022.  CBC showed that thrombocytopenia resolved and neutropenia improved to 1.5.  Rest of the work-up showed no evidence of nutritional deficiencies or paraproteinemia.  The hepatitis B surface antigen was positive which is concerning for active hepatitis B infection.  Patient was referred to gastroenterology and will see Dr. Christia Reading on 06/04/2022 for further recommendations.  Patient's underlying neutropenia which has been present for several years is likely a benign process which requires no further intervention at this time.  We will see the patient back in 6 months to repeat labs to assure stability.  She expressed understanding and satisfaction with the plan provided.

## 2022-06-04 ENCOUNTER — Encounter: Payer: Self-pay | Admitting: Internal Medicine

## 2022-06-04 ENCOUNTER — Other Ambulatory Visit (INDEPENDENT_AMBULATORY_CARE_PROVIDER_SITE_OTHER): Payer: Medicare Other

## 2022-06-04 ENCOUNTER — Ambulatory Visit (INDEPENDENT_AMBULATORY_CARE_PROVIDER_SITE_OTHER): Payer: Medicare Other | Admitting: Internal Medicine

## 2022-06-04 VITALS — BP 140/90 | HR 51 | Ht 62.0 in | Wt 217.6 lb

## 2022-06-04 DIAGNOSIS — Z862 Personal history of diseases of the blood and blood-forming organs and certain disorders involving the immune mechanism: Secondary | ICD-10-CM | POA: Diagnosis not present

## 2022-06-04 DIAGNOSIS — R768 Other specified abnormal immunological findings in serum: Secondary | ICD-10-CM | POA: Diagnosis not present

## 2022-06-04 DIAGNOSIS — Z86010 Personal history of colonic polyps: Secondary | ICD-10-CM

## 2022-06-04 LAB — PROTIME-INR
INR: 1.1 ratio — ABNORMAL HIGH (ref 0.8–1.0)
Prothrombin Time: 11.7 s (ref 9.6–13.1)

## 2022-06-04 MED ORDER — NA SULFATE-K SULFATE-MG SULF 17.5-3.13-1.6 GM/177ML PO SOLN: 1.0000 | Freq: Once | ORAL | 0 refills | Status: AC

## 2022-06-04 NOTE — Patient Instructions (Addendum)
_______________________________________________________  If you are age 71 or older, your body mass index should be between 23-30. Your Body mass index is 39.8 kg/m. If this is out of the aforementioned range listed, please consider follow up with your Primary Care Provider.  If you are age 81 or younger, your body mass index should be between 19-25. Your Body mass index is 39.8 kg/m. If this is out of the aformentioned range listed, please consider follow up with your Primary Care Provider.   You will be contacted by [facility] in the next 2 days to arrange an Ultrasound with Elastography  The number on your caller ID will be [phone], please answer when they call.  If you have not heard from them in 2 days please call [phone] to schedule.     Your provider has requested that you go to the basement level for lab work before leaving today. Press "B" on the elevator. The lab is located at the first door on the left as you exit the elevator.  You have been scheduled for a colonoscopy. Please follow written instructions given to you at your visit today.  Please pick up your prep supplies at the pharmacy within the next 1-3 days. If you use inhalers (even only as needed), please bring them with you on the day of your procedure.  Due to recent changes in healthcare laws, you may see the results of your imaging and laboratory studies on MyChart before your provider has had a chance to review them.  We understand that in some cases there may be results that are confusing or concerning to you. Not all laboratory results come back in the same time frame and the provider may be waiting for multiple results in order to interpret others.  Please give [place] 48 hours in order for your provider to thoroughly review all the results before contacting the office for clarification of your results.    The [facility] providers would like to encourage you to use [facility] to communicate with  providers for non-urgent requests or questions.  Due to long hold times on the telephone, sending your provider a message by [facility] may be a faster and more efficient way to get a response.  Please allow 48 business hours for a response.  Please remember that this is for non-urgent requests.   Thank you for entrusting me with your care and for choosing [facility], Dr. Christia Reading

## 2022-06-04 NOTE — Progress Notes (Signed)
Chief Complaint: Positive Hepatitis B surface antigen  HPI : 71 year old female with history of neutropenia presents with positive hepatitis B surface antigen  She was never previously aware that she had hepatitis B. She has had the hepatitis B vaccination several decades ago. 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 Denies prior IVDU. Denies family history of liver disease. Her husband to her knowledge does not have hepatitis B. She has never been exposed to hepatitis B to her knowledge. Denies jaundice, scleral icterus, confusion, swelling, or bleeding. Last colonoscopy was 5 years ago that showed a polyp. Denies fevers or weight loss. Denies ab pain, N&V, dysphagia. Denies prior liver disease. Denies alcohol use.   Wt Readings from Last 3 Encounters:  06/04/22 217 lb 9.6 oz (98.7 kg)  05/13/22 221 lb 12.8 oz (100.6 kg)  01/09/19 212 lb 3.2 oz (96.3 kg)   Past Medical History:  Diagnosis Date   Arthritis of knee    Bradycardia    Bronchitis    Osteopenia 01/27/2017   Vaginitis    Past Surgical History:  Procedure Laterality Date   BREAST BIOPSY Right    History reviewed. No pertinent family history. Social History   Tobacco Use   Smoking status: Never   Smokeless tobacco: Never  Vaping Use   Vaping Use: Never used  Substance Use Topics   Alcohol use: No   Drug use: No   Current Outpatient Medications  Medication Sig Dispense Refill   atorvastatin (LIPITOR) 10 MG tablet Take 5 mg by mouth daily.     Calcium Carbonate-Vitamin D (CALTRATE 600+D PO) Take 600 mg by mouth in the morning and at bedtime.     No current facility-administered medications for this visit.   Allergies  Allergen Reactions   Pravastatin Other (See Comments)    Dizziness   Review of Systems: All systems reviewed and negative except where noted in HPI.   Physical Exam: BP (!) 140/90 (BP Location: Left Arm, Patient Position: Sitting,  Cuff Size: Large)   Pulse (!) 51   Ht '5\' 2"'$  (1.575 m)   Wt 217 lb 9.6 oz (98.7 kg)   SpO2 99%   BMI 39.80 kg/m  Constitutional: Pleasant,well-developed, female in no acute distress. HEENT: Normocephalic and atraumatic. Conjunctivae are normal. No scleral icterus. Cardiovascular: Normal rate, regular rhythm.  Pulmonary/chest: Effort normal and breath sounds normal. No wheezing, rales or rhonchi. Abdominal: Soft, nondistended, nontender. Bowel sounds active throughout. There are no masses palpable. No hepatomegaly. Extremities: No edema Neurological: Alert and oriented to person place and time. Skin: Skin is warm and dry. No rashes noted. Psychiatric: Normal mood and affect. Behavior is normal.  Labs 01/2019: CBC with low WBC of 2.9 and mildly low plts of 145.  Labs 05/13/22: CBC with low WBC of 3.5. CMP unremarkable. Hep B surface antigen reactive. Hep B surface antibody NR. HCV Ab NR. HIV NR  Colonoscopy 06/22/17:  No path report available Rec: 5 year follow up  ASSESSMENT AND PLAN: Positive Hepatitis B surface antigen History of thrombocytopenia History of colon polyp Patient was on recent laboratory work up found to have a positive hepatitis B surface antigen. Will plan for further labs to determine if she needs to be placed on hepatitis B treatment. Will also assess for liver fibrosis and rule out [place] by performing RUQ U/S with elastography. Patient has had some thrombocytopenia in the past so may  be at risk for advanced liver disease. Since patient is due for polyp surveillance, will arrange for colonoscopy next month - Check PT/INR, Hep B DNA quant, Hep B core antibody, Hep B E antigen, Hep B E antibody, hepatitis Delta antibody - RUQ U/S with elastography - Colonoscopy [facility] next month  Christia Reading, MD

## 2022-06-10 LAB — HEPATITIS B DNA, ULTRAQUANTITATIVE, PCR
Hepatitis B DNA: 1100 IU/mL — ABNORMAL HIGH
Hepatitis B virus DNA: 3.04 Log IU/mL — ABNORMAL HIGH

## 2022-06-10 LAB — HEPATITIS B E ANTIBODY: Hep B E Ab: REACTIVE — AB

## 2022-06-10 LAB — HEPATITIS B E ANTIGEN: Hep B E Ag: NONREACTIVE

## 2022-06-10 LAB — HEPATITIS B CORE ANTIBODY, TOTAL: Hep B Core Total Ab: REACTIVE — AB

## 2022-06-10 LAB — HEPATITIS DELTA ANTIBODY: Hepatitis D Ab, Total: POSITIVE — AB

## 2022-06-11 ENCOUNTER — Other Ambulatory Visit: Payer: Self-pay

## 2022-06-11 DIAGNOSIS — R768 Other specified abnormal immunological findings in serum: Secondary | ICD-10-CM

## 2022-06-16 ENCOUNTER — Other Ambulatory Visit (INDEPENDENT_AMBULATORY_CARE_PROVIDER_SITE_OTHER): Payer: Medicare Other

## 2022-06-16 DIAGNOSIS — R768 Other specified abnormal immunological findings in serum: Secondary | ICD-10-CM | POA: Diagnosis not present

## 2022-06-16 LAB — IBC + FERRITIN
Ferritin: 40.9 ng/mL (ref 10.0–291.0)
Iron: 85 ug/dL (ref 42–145)
Saturation Ratios: 28.9 % (ref 20.0–50.0)
TIBC: 294 ug/dL (ref 250.0–450.0)
Transferrin: 210 mg/dL — ABNORMAL LOW (ref 212.0–360.0)

## 2022-06-17 ENCOUNTER — Ambulatory Visit (HOSPITAL_COMMUNITY)
Admission: RE | Admit: 2022-06-17 | Discharge: 2022-06-17 | Disposition: A | Discharge status: Home / Self Care | Payer: Medicare Other | Source: Ambulatory Visit | Attending: Internal Medicine | Admitting: Internal Medicine

## 2022-06-17 DIAGNOSIS — Z86010 Personal history of colonic polyps: Secondary | ICD-10-CM | POA: Insufficient documentation

## 2022-06-17 DIAGNOSIS — Z862 Personal history of diseases of the blood and blood-forming organs and certain disorders involving the immune mechanism: Secondary | ICD-10-CM | POA: Insufficient documentation

## 2022-06-17 DIAGNOSIS — R768 Other specified abnormal immunological findings in serum: Secondary | ICD-10-CM | POA: Diagnosis present

## 2022-06-20 LAB — HEPATITIS D VIRUS RNA, QUANTITATIVE REAL-TIME PCR
HEPATITIS D VIRUS RNA, QN, PCR: NOT DETECTED Log IU/mL
HEPATITIS D VIRUS RNA,QN,REAL TIME PCR: NOT DETECTED IU/mL

## 2022-06-20 LAB — HEPATITIS A ANTIBODY, TOTAL: Hepatitis A AB,Total: REACTIVE — AB

## 2022-07-16 ENCOUNTER — Other Ambulatory Visit: Payer: Self-pay | Admitting: Internal Medicine

## 2022-07-16 DIAGNOSIS — Z13820 Encounter for screening for osteoporosis: Secondary | ICD-10-CM

## 2022-07-24 ENCOUNTER — Encounter: Payer: Self-pay | Admitting: Internal Medicine

## 2022-07-24 ENCOUNTER — Ambulatory Visit (AMBULATORY_SURGERY_CENTER): Payer: Medicare Other | Admitting: Internal Medicine

## 2022-07-24 VITALS — BP 144/80 | HR 48 | Temp 97.1°F | Resp 13 | Ht 62.0 in | Wt 217.0 lb

## 2022-07-24 DIAGNOSIS — Z09 Encounter for follow-up examination after completed treatment for conditions other than malignant neoplasm: Secondary | ICD-10-CM

## 2022-07-24 DIAGNOSIS — Z86010 Personal history of colonic polyps: Secondary | ICD-10-CM

## 2022-07-24 DIAGNOSIS — D120 Benign neoplasm of cecum: Secondary | ICD-10-CM | POA: Diagnosis not present

## 2022-07-24 DIAGNOSIS — D122 Benign neoplasm of ascending colon: Secondary | ICD-10-CM | POA: Diagnosis not present

## 2022-07-24 DIAGNOSIS — K6289 Other specified diseases of anus and rectum: Secondary | ICD-10-CM

## 2022-07-24 DIAGNOSIS — D123 Benign neoplasm of transverse colon: Secondary | ICD-10-CM | POA: Diagnosis not present

## 2022-07-24 MED ORDER — SODIUM CHLORIDE 0.9 % IV SOLN: 500.0000 mL | INTRAVENOUS | Status: DC

## 2022-07-24 NOTE — Progress Notes (Signed)
Patient reports no changes to health or medications since her office visit

## 2022-07-24 NOTE — Patient Instructions (Addendum)
-   Discharge patient to home (with escort). - Await pathology results. - The findings and recommendations were discussed with the patient. - Return to GI clinic in 2 months for follow up of hepatitis B- Thursday February 15 at 10:30 am  Please continue your normal medications  Please read over handouts about polyps and diverticulosis    YOU HAD AN ENDOSCOPIC PROCEDURE TODAY AT [facility]:   Refer to the procedure report that was given to you for any specific questions about what was found during the examination.  If the procedure report does not answer your questions, please call your gastroenterologist to clarify.  If you requested that your care partner not be given the details of your procedure findings, then the procedure report has been included in a sealed envelope for you to review at your convenience later.  YOU SHOULD EXPECT: Some feelings of bloating in the abdomen. Passage of more gas than usual.  Walking can help get rid of the air that was put into your GI tract during the procedure and reduce the bloating. If you had a lower endoscopy (such as a colonoscopy or flexible sigmoidoscopy) you may notice spotting of blood in your stool or on the toilet paper. If you underwent a bowel prep for your procedure, you may not have a normal bowel movement for a few days.  Please Note:  You might notice some irritation and congestion in your nose or some drainage.  This is from the oxygen used during your procedure.  There is no need for concern and it should clear up in a day or so.  SYMPTOMS TO REPORT IMMEDIATELY:  Following lower endoscopy (colonoscopy or flexible sigmoidoscopy):  Excessive amounts of blood in the stool  Significant tenderness or worsening of abdominal pains  Swelling of the abdomen that is new, acute  Fever of 100F or higher    For urgent or emergent issues, a gastroenterologist can be reached at any hour by calling [phone]. Do not use MyChart  messaging for urgent concerns.    DIET:  We do recommend a small meal at first, but then you may proceed to your regular diet.  Drink plenty of fluids but you should avoid alcoholic beverages for 24 hours.  ACTIVITY:  You should plan to take it easy for the rest of today and you should NOT DRIVE or use heavy machinery until tomorrow (because of the sedation medicines used during the test).    FOLLOW UP: Our staff will call the number listed on your records the next business day following your procedure.  We will call around 7:15- 8:00 am to check on you and address any questions or concerns that you may have regarding the information given to you following your procedure. If we do not reach you, we will leave a message.     If any biopsies were taken you will be contacted by phone or by letter within the next 1-3 weeks.  Please call us at [phone] if you have not heard about the biopsies in 3 weeks.    SIGNATURES/CONFIDENTIALITY: You and/or your care partner have signed paperwork which will be entered into your electronic medical record.  These signatures attest to the fact that that the information above on your After Visit Summary has been reviewed and is understood.  Full responsibility of the confidentiality of this discharge information lies with you and/or your care-partner.

## 2022-07-24 NOTE — Progress Notes (Signed)
PT taken to PACU. Monitors in place. VSS. Report given to RN. 

## 2022-07-24 NOTE — Op Note (Signed)
[facility] Patient Name: Jessica Combs Procedure Date: 07/24/2022 9:43 AM MRN: [id number] Endoscopist: Georgian Co , , [id number] Age: 71 Referring MD:  Date of Birth: 11-Mar-1951 Gender: Female Account #: [id number] Procedure:                Colonoscopy Indications:              High risk colon cancer surveillance: Personal                            history of colonic polyps Medicines:                Monitored Anesthesia Care Procedure:                Pre-Anesthesia Assessment:                           - Prior to the procedure, a History and Physical                            was performed, and patient medications and                            allergies were reviewed. The patient's tolerance of                            previous anesthesia was also reviewed. The risks                            and benefits of the procedure and the sedation                            options and risks were discussed with the patient.                            All questions were answered, and informed consent                            was obtained. Prior Anticoagulants: The patient has                            taken no anticoagulant or antiplatelet agents. ASA                            Grade Assessment: III - A patient with severe                            systemic disease. After reviewing the risks and                            benefits, the patient was deemed in satisfactory                            condition to undergo the procedure.  After obtaining informed consent, the colonoscope                            was passed under direct vision. Throughout the                            procedure, the patient's blood pressure, pulse, and                            oxygen saturations were monitored continuously. The                            Colonoscope was introduced through the anus and                            advanced to the the  cecum, identified by                            appendiceal orifice and ileocecal valve. The                            colonoscopy was performed without difficulty. The                            patient tolerated the procedure well. The quality                            of the bowel preparation was good. The ileocecal                            valve, appendiceal orifice, and rectum were                            photographed. Scope In: 9:57:20 AM Scope Out: 10:22:27 AM Scope Withdrawal Time: 0 hours 21 minutes 1 second  Total Procedure Duration: 0 hours 25 minutes 7 seconds  Findings:                 Multiple diverticula were found in the entire colon.                           Three sessile polyps were found in the transverse                            colon and cecum. The polyps were 3 to 5 mm in size.                            These polyps were removed with a cold snare.                            Resection and retrieval were complete.                           A localized area of mildly nodular mucosa was found  in the rectum. Biopsies were taken with a cold                            forceps for histology.                           Non-bleeding internal hemorrhoids were found during                            retroflexion. Complications:            No immediate complications. Estimated Blood Loss:     Estimated blood loss was minimal. Impression:               - Diverticulosis in the entire examined colon.                           - Three 3 to 5 mm polyps in the transverse colon                            and in the cecum, removed with a cold snare.                            Resected and retrieved.                           - Nodular mucosa in the rectum. Biopsied.                           - Non-bleeding internal hemorrhoids. Recommendation:           - Discharge patient to home (with escort).                           - Await pathology results.                            - The findings and recommendations were discussed                            with the patient.                           - Return to GI clinic in 2 months for follow up of                            hepatitis B. Dr Georgian Co "[place]" [place],  07/24/2022 10:29:23 AM

## 2022-07-24 NOTE — Progress Notes (Signed)
GASTROENTEROLOGY PROCEDURE H&P NOTE   Primary Care Physician: Audley Hose, MD    Reason for Procedure:   History of colon polyps  Plan:    Colonoscopy  Patient is appropriate for endoscopic procedure(s) in the ambulatory ([facility]) setting.  The nature of the procedure, as well as the risks, benefits, and alternatives were carefully and thoroughly reviewed with the patient. Ample time for discussion and questions allowed. The patient understood, was satisfied, and agreed to proceed.     HPI: Jessica Combs is a 71 y.o. female who presents for colonoscopy for evaluation of history of colon polyps .  Patient was most recently seen in the Gastroenterology Clinic on 06/04/22.  No interval change in medical history since that appointment. Please refer to that note for full details regarding GI history and clinical presentation.   Past Medical History:  Diagnosis Date   Arthritis of knee    Bradycardia    Bronchitis    Hepatitis B    Osteopenia 01/27/2017   Vaginitis     Past Surgical History:  Procedure Laterality Date   BREAST BIOPSY Right    COLONOSCOPY      Prior to Admission medications   Medication Sig Start Date End Date Taking? Authorizing Provider  atorvastatin (LIPITOR) 10 MG tablet Take 5 mg by mouth daily. 05/27/22   [provider]  Calcium Carbonate-Vitamin D (CALTRATE 600+D PO) Take 600 mg by mouth in the morning and at bedtime.    [provider]    Current Outpatient Medications  Medication Sig Dispense Refill   atorvastatin (LIPITOR) 10 MG tablet Take 5 mg by mouth daily.     Calcium Carbonate-Vitamin D (CALTRATE 600+D PO) Take 600 mg by mouth in the morning and at bedtime.     Current Facility-Administered Medications  Medication Dose Route Frequency Provider Last Rate Last Admin   0.9 %  sodium chloride infusion  500 mL Intravenous Continuous Sharyn Creamer, MD        Allergies as of 07/24/2022 - Review Complete  07/24/2022  Allergen Reaction Noted   Pravastatin Other (See Comments) 03/03/2016    Family History  Problem Relation Age of Onset   Colon cancer Neg Hx    Rectal cancer Neg Hx    Esophageal cancer Neg Hx    Stomach cancer Neg Hx     Social History   Socioeconomic History   Marital status: Married    Spouse name: Not on file   Number of children: Not on file   Years of education: Not on file   Highest education level: Not on file  Occupational History   Not on file  Tobacco Use   Smoking status: Never   Smokeless tobacco: Never  Vaping Use   Vaping Use: Never used  Substance and Sexual Activity   Alcohol use: No   Drug use: No   Sexual activity: Yes    Partners: Male    Birth control/protection: Post-menopausal  Other Topics Concern   Not on file  Social History Narrative   Diet: Regular      Do you drink/ eat things with caffeine?Yes ,In small ammounts      Marital status: Married                              What year were you married ? 1976      Do you live in a house, apartment,assistred living,  condo, trailer, etc.)? House      Is it one or more stories? One story      How many persons live in your home ?  2 people      Do you have any pets in your home ?(please list) No      Current or past profession: Certified Nursing Assistant      Do you exercise?  Yes                            Type & how often: Walking ( every day)      Do you have a living will? No      Do you have a DNR form?  No                     If not, do you want to discuss one? Yes      Do you have signed POA?HPOA forms?  No               If so, please bring to your        appointment      Social Determinants of Health   Financial Resource Strain: Not on file  Food Insecurity: Not on file  Transportation Needs: Not on file  Physical Activity: Not on file  Stress: Not on file  Social Connections: Not on file  Intimate Partner Violence: Not on file    Physical Exam: Vital  signs in last 24 hours: BP (!) 171/74   Pulse (!) 52   Temp (!) 97.1 F (36.2 C)   Ht '5\' 2"'$  (1.575 m)   Wt 217 lb (98.4 kg)   SpO2 100%   BMI 39.69 kg/m  GEN: NAD EYE: Sclerae anicteric ENT: MMM CV: Non-tachycardic Pulm: No increased WOB GI: Soft NEURO:  Alert & Oriented   Christia Reading, MD [facility]   07/24/2022 9:45 AM

## 2022-07-24 NOTE — Progress Notes (Signed)
Called to room to assist during endoscopic procedure.  Patient ID and intended procedure confirmed with present staff. Received instructions for my participation in the procedure from the performing physician.  

## 2022-07-27 ENCOUNTER — Telehealth: Payer: Self-pay | Admitting: *Deleted

## 2022-07-27 NOTE — Telephone Encounter (Signed)
  Follow up Call-     07/24/2022    9:03 AM  Call back number  Post procedure Call Back phone  # [phone]  Permission to leave phone message Yes     Patient questions:  Message left to call us if necessary.

## 2022-07-28 ENCOUNTER — Encounter: Payer: Self-pay | Admitting: Internal Medicine

## 2022-07-29 ENCOUNTER — Ambulatory Visit: Payer: No Typology Code available for payment source

## 2022-08-13 ENCOUNTER — Ambulatory Visit
Admission: RE | Admit: 2022-08-13 | Discharge: 2022-08-13 | Disposition: A | Discharge status: Home / Self Care | Payer: Medicare Other | Source: Ambulatory Visit | Attending: Internal Medicine | Admitting: Internal Medicine

## 2022-08-13 DIAGNOSIS — Z1231 Encounter for screening mammogram for malignant neoplasm of breast: Secondary | ICD-10-CM

## 2022-08-28 DIAGNOSIS — E785 Hyperlipidemia, unspecified: Secondary | ICD-10-CM | POA: Diagnosis not present

## 2022-08-28 DIAGNOSIS — D696 Thrombocytopenia, unspecified: Secondary | ICD-10-CM | POA: Diagnosis not present

## 2022-09-09 DIAGNOSIS — E785 Hyperlipidemia, unspecified: Secondary | ICD-10-CM | POA: Diagnosis not present

## 2022-09-09 DIAGNOSIS — D696 Thrombocytopenia, unspecified: Secondary | ICD-10-CM | POA: Diagnosis not present

## 2022-09-09 DIAGNOSIS — K760 Fatty (change of) liver, not elsewhere classified: Secondary | ICD-10-CM | POA: Diagnosis not present

## 2022-09-09 DIAGNOSIS — D72819 Decreased white blood cell count, unspecified: Secondary | ICD-10-CM | POA: Diagnosis not present

## 2022-09-09 DIAGNOSIS — R030 Elevated blood-pressure reading, without diagnosis of hypertension: Secondary | ICD-10-CM | POA: Diagnosis not present

## 2022-09-09 DIAGNOSIS — Z23 Encounter for immunization: Secondary | ICD-10-CM | POA: Diagnosis not present

## 2022-09-09 DIAGNOSIS — R768 Other specified abnormal immunological findings in serum: Secondary | ICD-10-CM | POA: Diagnosis not present

## 2022-09-24 ENCOUNTER — Ambulatory Visit: Payer: Medicare Other | Admitting: Internal Medicine

## 2022-11-03 ENCOUNTER — Ambulatory Visit: Payer: Medicare Other | Admitting: Internal Medicine

## 2022-11-03 ENCOUNTER — Encounter: Payer: Self-pay | Admitting: Internal Medicine

## 2022-11-03 ENCOUNTER — Other Ambulatory Visit (INDEPENDENT_AMBULATORY_CARE_PROVIDER_SITE_OTHER): Payer: Medicare Other

## 2022-11-03 VITALS — BP 120/72 | HR 64 | Ht 62.0 in | Wt 210.0 lb

## 2022-11-03 DIAGNOSIS — B181 Chronic viral hepatitis B without delta-agent: Secondary | ICD-10-CM | POA: Diagnosis not present

## 2022-11-03 LAB — HEPATIC FUNCTION PANEL
ALT: 16 U/L (ref 0–35)
AST: 19 U/L (ref 0–37)
Albumin: 4.2 g/dL (ref 3.5–5.2)
Alkaline Phosphatase: 53 U/L (ref 39–117)
Bilirubin, Direct: 0.1 mg/dL (ref 0.0–0.3)
Total Bilirubin: 0.5 mg/dL (ref 0.2–1.2)
Total Protein: 7.2 g/dL (ref 6.0–8.3)

## 2022-11-03 NOTE — Progress Notes (Signed)
   Chief Complaint: Hepatitis B infection  HPI : 72 year old female with history of neutropenia presents for follow up of hepatitis B infection  Interval History: Denies jaundice, scleral icterus, abdominal swelling, BLE edema, confusion, blood in stools. Denies alcohol use. She drinks coffee. She is working on losing weight.   Wt Readings from Last 3 Encounters:  11/03/22 210 lb (95.3 kg)  07/24/22 217 lb (98.4 kg)  06/04/22 217 lb 9.6 oz (98.7 kg)   Current Outpatient Medications  Medication Sig Dispense Refill   atorvastatin (LIPITOR) 10 MG tablet Take 5 mg by mouth daily.     Calcium Carbonate-Vitamin D (CALTRATE 600+D PO) Take 600 mg by mouth in the morning and at bedtime.     No current facility-administered medications for this visit.   Physical Exam: BP 120/72 (BP Location: Left Arm, Patient Position: Sitting, Cuff Size: Normal)   Pulse 64   Ht 5\' 2"  (1.575 m)   Wt 210 lb (95.3 kg)   BMI 38.41 kg/m  Constitutional: Pleasant,well-developed, female in no acute distress. HEENT: Normocephalic and atraumatic. Conjunctivae are normal. No scleral icterus. Cardiovascular: Normal rate, regular rhythm.  Pulmonary/chest: Effort normal and breath sounds normal. No wheezing, rales or rhonchi. Abdominal: Soft, nondistended, nontender. Bowel sounds active throughout. There are no masses palpable. No hepatomegaly. Extremities: No edema Neurological: Alert and oriented to person place and time. Skin: Skin is warm and dry. No rashes noted. Psychiatric: Normal mood and affect. Behavior is normal.  Labs 01/2019: CBC with low WBC of 2.9 and mildly low plts of 145.  Labs 05/13/22: CBC with low WBC of 3.5. CMP unremarkable. Hep B surface antigen reactive. Hep B surface antibody NR. HCV Ab NR. HIV NR  Labs 06/04/22: Hepatitis B DNA 1100. Hepatitis B core antibody positive.  Labs 06/2022: Iron levels nml. HDV RNA not detected. Hepatitis A antibody reactive. Hepatitis B E antigen NR. Hepatitis  B E antibody reactive. Hepatitis D antibody positive. INR mildly elevated at 1.1.   Abd U/S with elastography 06/17/22: IMPRESSION: ULTRASOUND ABDOMEN: Echogenic liver, question fatty infiltration versus cirrhosis. Inadequate visualization of IVC and pancreas. ULTRASOUND HEPATIC ELASTOGRAPHY: Median kPa:  5.1 Diagnostic category: < or = 9 kPa: in the absence of other known clinical signs, rules out cACLD  Colonoscopy 06/22/17:  No path report available Rec: 5 year follow up  Colonoscopy 07/24/22:  Path: 1. Surgical [P], colon, transverse and cecum, polyp (3) TUBULAR ADENOMA, 4 FRAGMENTS NEGATIVE FOR HIGH-GRADE DYSPLASIA AND CARCINOMA 2. Surgical [P], colon, rectal lesion biopsies SQUAMOCOLONIC JUNCTIONAL TYPE MUCOSA WITH FOCAL REACTIVE/PROLAPSE CHANGES NEGATIVE FOR DYSPLASIA AND CARCINOMA  ASSESSMENT AND PLAN: Chronic hepatitis B History of thrombocytopenia History of colon polyp Patient was confirmed on recent labs to have chronic hepatitis B that is likely in an inactive carrier state. Will recheck her labs today. Will plan to recheck her labs at three month intervals for 1 year to make sure that she would not benefit from treatment of her hepatitis B. Her last elastography exam did not show significant fibrosis, which is good news.  - Check HBV DNA and LFTs - Next colonoscopy in 07/2025 for history of polyps - RTC 3 months  Christia Reading, MD  I spent 33 minutes of time, including in depth chart review, independent review of results as outlined above, communicating results with the patient directly, face-to-face time with the patient, coordinating care, and ordering studies and medications as appropriate, and documentation.

## 2022-11-03 NOTE — Patient Instructions (Addendum)
Your provider has requested that you go to the basement level for lab work before leaving today. Press "B" on the elevator. The lab is located at the first door on the left as you exit the elevator.   You are schedule for follow up visit on 02/05/23 at 8:50 am _______________________________________________________  If your blood pressure at your visit was 140/90 or greater, please contact your primary care physician to follow up on this.  _______________________________________________________  If you are age 72 or older, your body mass index should be between 23-30. Your Body mass index is 38.41 kg/m. If this is out of the aforementioned range listed, please consider follow up with your Primary Care Provider.  If you are age 59 or younger, your body mass index should be between 19-25. Your Body mass index is 38.41 kg/m. If this is out of the aformentioned range listed, please consider follow up with your Primary Care Provider.   ________________________________________________________  The [place] [facility] providers would like to encourage you to use [facility] to communicate with providers for non-urgent requests or questions.  Due to long hold times on the telephone, sending your provider a message by [facility] may be a faster and more efficient way to get a response.  Please allow 48 business hours for a response.  Please remember that this is for non-urgent requests.  _______________________________________________________   Due to recent changes in healthcare laws, you may see the results of your imaging and laboratory studies on MyChart before your provider has had a chance to review them.  We understand that in some cases there may be results that are confusing or concerning to you. Not all laboratory results come back in the same time frame and the provider may be waiting for multiple results in order to interpret others.  Please give [place] 48 hours in order for your provider to thoroughly review all  the results before contacting the office for clarification of your results.    Thank you for entrusting me with your care and for choosing [facility], Dr. Christia Reading

## 2022-11-06 LAB — HEPATITIS B DNA, ULTRAQUANTITATIVE, PCR
Hepatitis B DNA: 813 IU/mL — ABNORMAL HIGH
Hepatitis B virus DNA: 2.91 Log IU/mL — ABNORMAL HIGH

## 2022-11-11 ENCOUNTER — Other Ambulatory Visit: Payer: Self-pay | Admitting: Physician Assistant

## 2022-11-11 ENCOUNTER — Inpatient Hospital Stay: Payer: Medicare Other | Attending: Physician Assistant

## 2022-11-11 ENCOUNTER — Inpatient Hospital Stay: Payer: Medicare Other | Admitting: Physician Assistant

## 2022-11-11 ENCOUNTER — Telehealth: Payer: Self-pay | Admitting: Hematology and Oncology

## 2022-11-11 VITALS — BP 139/76 | HR 59 | Temp 97.5°F | Resp 18 | Wt 208.8 lb

## 2022-11-11 DIAGNOSIS — M8580 Other specified disorders of bone density and structure, unspecified site: Secondary | ICD-10-CM | POA: Diagnosis not present

## 2022-11-11 DIAGNOSIS — D708 Other neutropenia: Secondary | ICD-10-CM

## 2022-11-11 DIAGNOSIS — D696 Thrombocytopenia, unspecified: Secondary | ICD-10-CM | POA: Diagnosis not present

## 2022-11-11 DIAGNOSIS — Z79899 Other long term (current) drug therapy: Secondary | ICD-10-CM | POA: Insufficient documentation

## 2022-11-11 LAB — CMP (CANCER CENTER ONLY)
ALT: 19 U/L (ref 0–44)
AST: 23 U/L (ref 15–41)
Albumin: 4.1 g/dL (ref 3.5–5.0)
Alkaline Phosphatase: 49 U/L (ref 38–126)
Anion gap: 8 (ref 5–15)
BUN: 16 mg/dL (ref 8–23)
CO2: 27 mmol/L (ref 22–32)
Calcium: 9.1 mg/dL (ref 8.9–10.3)
Chloride: 105 mmol/L (ref 98–111)
Creatinine: 0.91 mg/dL (ref 0.44–1.00)
GFR, Estimated: >60 mL/min (ref >60)
Glucose, Bld: 84 mg/dL (ref 70–99)
Potassium: 3.5 mmol/L (ref 3.5–5.1)
Sodium: 140 mmol/L (ref 135–145)
Total Bilirubin: 0.8 mg/dL (ref 0.3–1.2)
Total Protein: 7.1 g/dL (ref 6.5–8.1)

## 2022-11-11 LAB — CBC WITH DIFFERENTIAL (CANCER CENTER ONLY)
Abs Immature Granulocytes: 0 10*3/uL (ref 0.00–0.07)
Basophils Absolute: 0 10*3/uL (ref 0.0–0.1)
Basophils Relative: 1 %
Eosinophils Absolute: 0.1 10*3/uL (ref 0.0–0.5)
Eosinophils Relative: 2 %
HCT: 37.5 % (ref 36.0–46.0)
Hemoglobin: 12.5 g/dL (ref 12.0–15.0)
Immature Granulocytes: 0 %
Lymphocytes Relative: 45 %
Lymphs Abs: 1.4 10*3/uL (ref 0.7–4.0)
MCH: 31.1 pg (ref 26.0–34.0)
MCHC: 33.3 g/dL (ref 30.0–36.0)
MCV: 93.3 fL (ref 80.0–100.0)
Monocytes Absolute: 0.3 10*3/uL (ref 0.1–1.0)
Monocytes Relative: 10 %
Neutro Abs: 1.3 10*3/uL — ABNORMAL LOW (ref 1.7–7.7)
Neutrophils Relative %: 42 %
Platelet Count: 139 10*3/uL — ABNORMAL LOW (ref 150–400)
RBC: 4.02 MIL/uL (ref 3.87–5.11)
RDW: 13.9 % (ref 11.5–15.5)
WBC Count: 3.1 10*3/uL — ABNORMAL LOW (ref 4.0–10.5)
nRBC: 0 % (ref 0.0–0.2)

## 2022-11-11 NOTE — Telephone Encounter (Signed)
Reached out to patient to schedule per 4/3 LOS, left voicemail.

## 2022-11-12 NOTE — Progress Notes (Signed)
[place] [phone]   [phone]  PROGRESS NOTE  Patient Care Team: Audley Hose, MD as PCP - General (Internal Medicine)  CHIEF COMPLAINTS/PURPOSE OF CONSULTATION:  Thrombocytopenia Neutropenia  HISTORY OF PRESENTING ILLNESS:  Jessica Combs 72 y.o. female who returns to the clinic for a follow up for thrombocytopenia and neutropenia.  She is unaccompanied for this visit.  On exam today, Jessica Combs reports that she is doing well without any major changes to her health. She is trying to eat healthy and exercise in order to loose weight as recent abdominal US showed possible fatty liver. She has lost approximately 10 lbs since December 2023. She denies any GI symptoms including nausea, vomiting, diarrhea or constipation. She denies easy bruising or signs of bleeding.  She denies fevers, chills, night sweats, shortness of breath, chest pain or cough.  She has no other complaints.  Rest of 10 point ROS is below.  MEDICAL HISTORY:  Past Medical History:  Diagnosis Date   Arthritis of knee    Bradycardia    Bronchitis    Hepatitis B    Osteopenia 01/27/2017   Vaginitis     SURGICAL HISTORY: Past Surgical History:  Procedure Laterality Date   BREAST BIOPSY Right    COLONOSCOPY      SOCIAL HISTORY: Social History   Socioeconomic History   Marital status: Married    Spouse name: Not on file   Number of children: 4   Years of education: Not on file   Highest education level: Not on file  Occupational History   Not on file  Tobacco Use   Smoking status: Never   Smokeless tobacco: Never  Vaping Use   Vaping Use: Never used  Substance and Sexual Activity   Alcohol use: No   Drug use: No   Sexual activity: Yes    Partners: Male    Birth control/protection: Post-menopausal  Other Topics Concern   Not on file  Social History Narrative   Diet: Regular      Do you drink/ eat things with caffeine?Yes ,In small  ammounts      Marital status: Married                              What year were you married ? 1976      Do you live in a house, apartment,assistred living, condo, trailer, etc.)? House      Is it one or more stories? One story      How many persons live in your home ?  2 people      Do you have any pets in your home ?(please list) No      Current or past profession: Certified Nursing Assistant      Do you exercise?  Yes                            Type & how often: Walking ( every day)      Do you have a living will? No      Do you have a DNR form?  No                     If not, do you want to discuss one? Yes      Do you have signed POA?HPOA forms?  No  If so, please bring to your        appointment      Social Determinants of Health   Financial Resource Strain: Not on file  Food Insecurity: Not on file  Transportation Needs: Not on file  Physical Activity: Not on file  Stress: Not on file  Social Connections: Not on file  Intimate Partner Violence: Not on file    FAMILY HISTORY: Family History  Problem Relation Age of Onset   Colon cancer Neg Hx    Rectal cancer Neg Hx    Esophageal cancer Neg Hx    Stomach cancer Neg Hx    Breast cancer Neg Hx     ALLERGIES:  is allergic to pravachol [pravastatin].  MEDICATIONS:  Current Outpatient Medications  Medication Sig Dispense Refill   atorvastatin (LIPITOR) 10 MG tablet Take 5 mg by mouth daily.     Calcium Carbonate-Vitamin D (CALTRATE 600+D PO) Take 600 mg by mouth in the morning and at bedtime.     No current facility-administered medications for this visit.    REVIEW OF SYSTEMS:   Constitutional: ( - ) fevers, ( - )  chills , ( - ) night sweats Eyes: ( - ) blurriness of vision, ( - ) double vision, ( - ) watery eyes Ears, nose, mouth, throat, and face: ( - ) mucositis, ( - ) sore throat Respiratory: ( - ) cough, ( - ) dyspnea, ( - ) wheezes Cardiovascular: ( - ) palpitation, ( - ) chest  discomfort, ( - ) lower extremity swelling Gastrointestinal:  ( - ) nausea, ( - ) heartburn, ( - ) change in bowel habits Skin: ( - ) abnormal skin rashes Lymphatics: ( - ) new lymphadenopathy, ( - ) easy bruising Neurological: ( - ) numbness, ( - ) tingling, ( - ) new weaknesses Behavioral/Psych: ( - ) mood change, ( - ) new changes  All other systems were reviewed with the patient and are negative.  PHYSICAL EXAMINATION: ECOG PERFORMANCE STATUS: 0 - Asymptomatic  Vitals:   11/11/22 1450  BP: 139/76  Pulse: (!) 59  Resp: 18  Temp: (!) 97.5 F (36.4 C)  SpO2: 100%   Filed Weights   11/11/22 1450  Weight: 208 lb 12.8 oz (94.7 kg)    GENERAL: well appearing female in NAD  SKIN: skin color, texture, turgor are normal, no rashes or significant lesions EYES: conjunctiva are pink and non-injected, sclera clear LUNGS: clear to auscultation and percussion with normal breathing effort HEART: regular rate & rhythm and no murmurs and no lower extremity edema Musculoskeletal: no cyanosis of digits and no clubbing  PSYCH: alert & oriented x 3, fluent speech NEURO: no focal motor/sensory deficits  LABORATORY DATA:  I have reviewed the data as listed    Latest Ref Rng & Units 11/11/2022    2:43 PM 05/13/2022    3:22 PM 01/09/2019    8:56 AM  CBC  WBC 4.0 - 10.5 K/uL 3.1  3.5  2.9   Hemoglobin 12.0 - 15.0 g/dL 12.5  12.5  12.8   Hematocrit 36.0 - 46.0 % 37.5  37.6  39.0   Platelets 150 - 400 K/uL 139  170  145        Latest Ref Rng & Units 11/11/2022    2:43 PM 11/03/2022    3:14 PM 05/13/2022    3:22 PM  CMP  Glucose 70 - 99 mg/dL 84   86   BUN 8 - 23 mg/dL  16   19   Creatinine 0.44 - 1.00 mg/dL 0.91   0.81   Sodium 135 - 145 mmol/L 140   140   Potassium 3.5 - 5.1 mmol/L 3.5   3.6   Chloride 98 - 111 mmol/L 105   109   CO2 22 - 32 mmol/L 27   26   Calcium 8.9 - 10.3 mg/dL 9.1   9.1   Total Protein 6.5 - 8.1 g/dL 7.1  7.2  7.2   Total Bilirubin 0.3 - 1.2 mg/dL 0.8  0.5  0.9    Alkaline Phos 38 - 126 U/L 49  53  54   AST 15 - 41 U/L 23  19  24    ALT 0 - 44 U/L 19  16  19     ASSESSMENT & PLAN Jessica Combs is a 72 y.o. female returns for a follow up for neutropenia and thrombocytopenia.    #Thrombocytopenia/Neutropenia: --Workup from 05/27/2023 ruled out nutritional deficiencies, paraproteinemia, active hepatitis B/C.  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 --Thrombocytopenia is transient and mild. Abdominal US from 06/17/22 did show signs of fatty liver which can contribute to thrombocytopenia.  --Labs today show WBC 3.1, Hgb 12.5,, Plt 139K, ANC 1.3. --No further workup required at this time --RTC in 1 year to monitor blood counts and if overall stable, okay to discharge from clinic and patient can follow up with PCP.   #Chronic hepatitis B: --Most consistent with inactive carrier state --Under the care of GI who is monitoring levels to determine if she needs treatment for hepatitis B in the future.    No orders of the defined types were placed in this encounter.   All questions were answered. The patient knows to call the clinic with any problems, questions or concerns.  I have spent a total of 25 minutes minutes of face-to-face and non-face-to-face time, preparing to see the patient, performing a medically appropriate examination, counseling and educating the patient, documenting clinical information in the electronic health record, and care coordination.   Dede Query, PA-C Department of Hematology/Oncology [place] [facility] Phone: [phone]

## 2022-11-25 ENCOUNTER — Other Ambulatory Visit: Payer: No Typology Code available for payment source

## 2022-11-25 ENCOUNTER — Ambulatory Visit: Payer: No Typology Code available for payment source | Admitting: Physician Assistant

## 2023-01-07 ENCOUNTER — Ambulatory Visit
Admission: RE | Admit: 2023-01-07 | Discharge: 2023-01-07 | Disposition: A | Discharge status: Home / Self Care | Payer: Medicare Other | Source: Ambulatory Visit | Attending: Internal Medicine | Admitting: Internal Medicine

## 2023-01-07 DIAGNOSIS — E349 Endocrine disorder, unspecified: Secondary | ICD-10-CM | POA: Diagnosis not present

## 2023-01-07 DIAGNOSIS — Z13820 Encounter for screening for osteoporosis: Secondary | ICD-10-CM

## 2023-02-05 ENCOUNTER — Ambulatory Visit: Payer: Medicare Other | Admitting: Internal Medicine

## 2023-02-05 ENCOUNTER — Encounter: Payer: Self-pay | Admitting: Internal Medicine

## 2023-02-05 ENCOUNTER — Other Ambulatory Visit (INDEPENDENT_AMBULATORY_CARE_PROVIDER_SITE_OTHER): Payer: Medicare Other

## 2023-02-05 VITALS — BP 140/90 | HR 52 | Ht 61.0 in | Wt 209.0 lb

## 2023-02-05 DIAGNOSIS — B181 Chronic viral hepatitis B without delta-agent: Secondary | ICD-10-CM | POA: Diagnosis not present

## 2023-02-05 LAB — HEPATIC FUNCTION PANEL
ALT: 13 U/L (ref 0–35)
AST: 18 U/L (ref 0–37)
Albumin: 3.9 g/dL (ref 3.5–5.2)
Alkaline Phosphatase: 51 U/L (ref 39–117)
Bilirubin, Direct: 0.2 mg/dL (ref 0.0–0.3)
Total Bilirubin: 0.6 mg/dL (ref 0.2–1.2)
Total Protein: 6.9 g/dL (ref 6.0–8.3)

## 2023-02-05 NOTE — Patient Instructions (Addendum)
Your provider has requested that you go to the basement level for lab work before leaving today. Press "B" on the elevator. The lab is located at the first door on the left as you exit the elevator.   You are schedule for a follow up visit on 05/19/23 at 8:50 am _______________________________________________________  If your blood pressure at your visit was 140/90 or greater, please contact your primary care physician to follow up on this.  _______________________________________________________  If you are age 72 or older, your body mass index should be between 23-30. Your Body mass index is 39.49 kg/m. If this is out of the aforementioned range listed, please consider follow up with your Primary Care Provider.  If you are age 30 or younger, your body mass index should be between 19-25. Your Body mass index is 39.49 kg/m. If this is out of the aformentioned range listed, please consider follow up with your Primary Care Provider.   ________________________________________________________  The [place] [facility] providers would like to encourage you to use [facility] to communicate with providers for non-urgent requests or questions.  Due to long hold times on the telephone, sending your provider a message by [facility] may be a faster and more efficient way to get a response.  Please allow 48 business hours for a response.  Please remember that this is for non-urgent requests.  _______________________________________________________    Due to recent changes in healthcare laws, you may see the results of your imaging and laboratory studies on MyChart before your provider has had a chance to review them.  We understand that in some cases there may be results that are confusing or concerning to you. Not all laboratory results come back in the same time frame and the provider may be waiting for multiple results in order to interpret others.  Please give [place] 48 hours in order for your provider to thoroughly review  all the results before contacting the office for clarification of your results.    Thank you for entrusting me with your care and for choosing [facility], Dr. Eulah Pont

## 2023-02-05 NOTE — Progress Notes (Signed)
Chief Complaint: Hepatitis B infection  HPI : 72 year old female with history of benign neutropenia, chronic hepatitis B, and thrombocytopenia presents for follow up of hepatitis B infection  Interval History: Denies jaundice, scleral icterus, abdominal swelling, confusion, or blood in stools. She only has swelling in her legs during airplane travel. Denies alcohol use. She drinks coffee. Weight has been stable. Her bowel habits are regular. She is eating and drinking well.   Wt Readings from Last 3 Encounters:  02/05/23 209 lb (94.8 kg)  11/11/22 208 lb 12.8 oz (94.7 kg)  11/03/22 210 lb (95.3 kg)   Current Outpatient Medications  Medication Sig Dispense Refill   atorvastatin (LIPITOR) 10 MG tablet Take 5 mg by mouth daily.     Calcium Carbonate-Vitamin D (CALTRATE 600+D PO) Take 600 mg by mouth in the morning and at bedtime.     No current facility-administered medications for this visit.   Physical Exam: BP (!) 140/90   Pulse (!) 52   Ht 5\' 1"  (1.549 m)   Wt 209 lb (94.8 kg)   BMI 39.49 kg/m  Constitutional: Pleasant,well-developed, female in no acute distress. HEENT: Normocephalic and atraumatic. Conjunctivae are normal. No scleral icterus. Cardiovascular: Normal rate, regular rhythm.  Pulmonary/chest: Effort normal and breath sounds normal. No wheezing, rales or rhonchi. Abdominal: Soft, nondistended, nontender. Bowel sounds active throughout. There are no masses palpable. No hepatomegaly. Extremities: No edema Neurological: No asterixis Skin: Skin is warm and dry. No rashes noted. Psychiatric: Normal mood and affect. Behavior is normal.  Labs 01/2019: CBC with low WBC of 2.9 and mildly low plts of 145.  Labs 05/13/22: CBC with low WBC of 3.5. CMP unremarkable. Hep B surface antigen reactive. Hep B surface antibody NR. HCV Ab NR. HIV NR  Labs 06/04/22: Hepatitis B DNA 1100. Hepatitis B core antibody positive.  Labs 06/2022: Iron levels nml. HDV RNA not detected.  Hepatitis A antibody reactive. Hepatitis B E antigen NR. Hepatitis B E antibody reactive. Hepatitis D antibody positive. INR mildly elevated at 1.1.   Labs 10/2022: LFTs nml. HBV DNA level 813.  Labs 11/2022: CBC with low plts of 139 and low WBC of 3.1  Abd U/S with elastography 06/17/22: IMPRESSION: ULTRASOUND ABDOMEN: Echogenic liver, question fatty infiltration versus cirrhosis. Inadequate visualization of IVC and pancreas. ULTRASOUND HEPATIC ELASTOGRAPHY: Median kPa:  5.1 Diagnostic category: < or = 9 kPa: in the absence of other known clinical signs, rules out cACLD  Colonoscopy 06/22/17:  No path report available Rec: 5 year follow up  Colonoscopy 07/24/22:  Path: 1. Surgical [P], colon, transverse and cecum, polyp (3) TUBULAR ADENOMA, 4 FRAGMENTS NEGATIVE FOR HIGH-GRADE DYSPLASIA AND CARCINOMA 2. Surgical [P], colon, rectal lesion biopsies SQUAMOCOLONIC JUNCTIONAL TYPE MUCOSA WITH FOCAL REACTIVE/PROLAPSE CHANGES NEGATIVE FOR DYSPLASIA AND CARCINOMA  ASSESSMENT AND PLAN: Chronic hepatitis B History of thrombocytopenia History of colon polyp Patient's chronic hepatitis B is likely in an inactive carrier state. Will recheck her labs today. She is currently getting labs rechecked at three month intervals for 1 year to make sure that she would not benefit from treatment of her hepatitis B. Her last elastography exam did not show significant fibrosis. - Check HBV DNA and LFTs - Next colonoscopy in 07/2025 for history of polyps - RTC 3 months  Eulah Pont, MD  I spent 31 minutes of time, including in depth chart review, independent review of results as outlined above, communicating results with the patient directly, face-to-face time with the patient, coordinating care,  and ordering studies and medications as appropriate, and documentation.

## 2023-02-07 LAB — HEPATITIS B DNA, ULTRAQUANTITATIVE, PCR
Hepatitis B DNA: 385 IU/mL — ABNORMAL HIGH
Hepatitis B virus DNA: 2.59 Log IU/mL — ABNORMAL HIGH

## 2023-03-08 ENCOUNTER — Other Ambulatory Visit: Payer: Self-pay

## 2023-03-08 ENCOUNTER — Ambulatory Visit (HOSPITAL_COMMUNITY)
Admission: EM | Admit: 2023-03-08 | Discharge: 2023-03-08 | Disposition: A | Discharge status: Home / Self Care | Payer: Medicare Other | Attending: Emergency Medicine | Admitting: Emergency Medicine

## 2023-03-08 ENCOUNTER — Encounter (HOSPITAL_COMMUNITY): Payer: Self-pay | Admitting: Emergency Medicine

## 2023-03-08 DIAGNOSIS — R110 Nausea: Secondary | ICD-10-CM

## 2023-03-08 DIAGNOSIS — R42 Dizziness and giddiness: Secondary | ICD-10-CM | POA: Diagnosis not present

## 2023-03-08 MED ORDER — ONDANSETRON 4 MG PO TBDP
ORAL_TABLET | ORAL | Status: AC
  Filled 2023-03-08: qty 1

## 2023-03-08 MED ORDER — MECLIZINE HCL 12.5 MG PO TABS: 12.5000 mg | ORAL_TABLET | Freq: Three times a day (TID) | ORAL | 0 refills | Status: DC | PRN

## 2023-03-08 MED ORDER — ONDANSETRON 4 MG PO TBDP: 4.0000 mg | ORAL_TABLET | Freq: Three times a day (TID) | ORAL | 0 refills | Status: DC | PRN

## 2023-03-08 MED ORDER — ONDANSETRON 4 MG PO TBDP
4.0000 mg | ORAL_TABLET | Freq: Once | ORAL | Status: AC
  Administered 2023-03-08: 4 mg via ORAL

## 2023-03-08 NOTE — ED Provider Notes (Signed)
[facility]    CSN: [id number] Arrival date & time: 03/08/23  1058      History   Chief Complaint Chief Complaint  Patient presents with   Nausea    HPI Jessica Combs is a 72 y.o. female.   Presents for evaluation of nausea and dizziness beginning this morning upon awakening.  Nausea feels like it radiates from the center of the stomach into the chest, denies vomiting.  Endorses that the stomach feels unsettled.  Dizziness described as feeling unwell and uneasy but denies the room spinning.  Has attempted use of lemon water which has been ineffective.  Denies lightheadedness, syncope, headache, visual changes, chest pain, shortness of breath.  Symptoms have not occurred before.  Denies dietary changes, medication changes or recent travel.  Has not attempted treatment of symptoms.    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 Authorizing Provider  atorvastatin (LIPITOR) 10 MG tablet Take 5 mg by mouth daily. 05/27/22   [provider]  Calcium Carbonate-Vitamin D (CALTRATE 600+D PO) Take 600 mg by mouth in the morning and at bedtime.    [provider]    Family History Family History  Problem Relation Age of Onset   Colon cancer Neg Hx    Rectal cancer Neg  Hx    Esophageal cancer Neg Hx    Stomach cancer Neg Hx    Breast cancer Neg Hx     Social History Social History   Tobacco Use   Smoking status: Never   Smokeless tobacco: Never  Vaping Use   Vaping status: Never Used  Substance Use Topics   Alcohol use: No   Drug use: No     Allergies   Pravachol [pravastatin]   Review of Systems Review of Systems   Physical Exam Triage Vital Signs ED Triage Vitals  Encounter Vitals Group     BP 03/08/23 1112 (!) 172/83     Systolic BP Percentile --      Diastolic BP Percentile --      Pulse Rate 03/08/23 1112 (!) 50     Resp 03/08/23 1112 20     Temp 03/08/23 1112 98.2 F (36.8 C)     Temp Source 03/08/23 1112 Oral     SpO2 03/08/23 1112 98 %     Weight --      Height --      Head Circumference --  Peak Flow --      Pain Score 03/08/23 1109 0     Pain Loc --      Pain Education --      Exclude from Growth Chart --    No data found.  Updated Vital Signs BP (!) 164/81 (BP Location: Right Arm) Comment (BP Location): large cuff, repositioned  Pulse (!) 50   Temp 98.2 F (36.8 C) (Oral)   Resp (!) 50   SpO2 98%   Visual Acuity Right Eye Distance:   Left Eye Distance:   Bilateral Distance:    Right Eye Near:   Left Eye Near:    Bilateral Near:     Physical Exam Constitutional:      Appearance: Normal appearance.  Eyes:     Extraocular Movements: Extraocular movements intact.  Cardiovascular:     Rate and Rhythm: Normal rate and regular rhythm.     Pulses: Normal pulses.     Heart sounds: Normal heart sounds.  Pulmonary:     Effort: Pulmonary effort is normal.     Breath sounds: Normal breath sounds.  Neurological:     General: No focal deficit present.     Mental Status: She is alert and oriented to person, place, and time. Mental status is at baseline.     Cranial Nerves: No cranial nerve deficit.     Motor: No weakness.     Gait: Gait normal.      UC Treatments / Results  Labs (all labs  ordered are listed, but only abnormal results are displayed) Labs Reviewed - No data to display  EKG   Radiology No results found.  Procedures Procedures (including critical care time)  Medications Ordered in UC Medications - No data to display  Initial Impression / Assessment and Plan / UC Course  I have reviewed the triage vital signs and the nursing notes.  Pertinent labs & imaging results that were available during my care of the patient were reviewed by me and considered in my medical decision making (see chart for details).  Nausea, dizziness  Unknown etiology, vital signs are stable and patient is in no signs of distress nontoxic-appearing, has been tolerating food, low suspicion for dehydration as cause, no abdominal pain on exam, low suspicion for acute infectious process or acute organ involvement, no prior cardiac or respiratory history, no current cardiac or respiratory symptoms, will move forward with treatment of symptoms, prescribe Zofran and meclizine and dose of Zofran given in office, advised to monitor closely and follow-up with PCP in 1 week for reevaluation Final Clinical Impressions(s) / UC Diagnoses   Final diagnoses:  None   Discharge Instructions   None    ED Prescriptions   None    PDMP not reviewed this encounter.   Valinda Hoar, NP 03/09/23 1130

## 2023-03-08 NOTE — ED Triage Notes (Signed)
Reports noticed symptoms at 7 am this morning when she woke up.  Reports feeling dizzy and nauseated, no vomiting.  Denies pain.  Denies ear pain.  Denies room spinning

## 2023-03-08 NOTE — Discharge Instructions (Signed)
Blood pressure and heart rate are stable low suspicion that this is the cause of your symptoms  You have been eating and drinking and have not had a recent respiratory illness, low suspicion that dehydration is the cause of your symptoms  Ears on exam show no signs of infection, do not believe this is the cause of your symptoms, ears are the place of equilibrium within the body  You may use meclizine 3 times daily as needed (every 8 hours)  You may use Zofran every 8 hours to help minimize nausea, place tablet underneath tongue and allow to dissolve, wait 30 minutes to hour before emptying to eat or drink  Continue to change positions slowly, waiting at least 10 seconds between movements to help the body stabilize   Ensure adequate intake of water as dehydration will make your symptoms worse  If your symptoms continue to persist please schedule follow-up appointment with your primary doctor in 1 week

## 2023-03-08 NOTE — ED Notes (Signed)
States heart rate runs in 50's Patient denies nausea at this time and declined receiving any nausea medicine

## 2023-04-09 DIAGNOSIS — E785 Hyperlipidemia, unspecified: Secondary | ICD-10-CM | POA: Diagnosis not present

## 2023-04-09 DIAGNOSIS — E559 Vitamin D deficiency, unspecified: Secondary | ICD-10-CM | POA: Diagnosis not present

## 2023-04-09 DIAGNOSIS — Z131 Encounter for screening for diabetes mellitus: Secondary | ICD-10-CM | POA: Diagnosis not present

## 2023-04-09 DIAGNOSIS — R768 Other specified abnormal immunological findings in serum: Secondary | ICD-10-CM | POA: Diagnosis not present

## 2023-04-14 DIAGNOSIS — D696 Thrombocytopenia, unspecified: Secondary | ICD-10-CM | POA: Diagnosis not present

## 2023-04-14 DIAGNOSIS — I10 Essential (primary) hypertension: Secondary | ICD-10-CM | POA: Diagnosis not present

## 2023-04-14 DIAGNOSIS — Z1231 Encounter for screening mammogram for malignant neoplasm of breast: Secondary | ICD-10-CM | POA: Diagnosis not present

## 2023-04-14 DIAGNOSIS — H903 Sensorineural hearing loss, bilateral: Secondary | ICD-10-CM | POA: Diagnosis not present

## 2023-04-14 DIAGNOSIS — Z131 Encounter for screening for diabetes mellitus: Secondary | ICD-10-CM | POA: Diagnosis not present

## 2023-04-14 DIAGNOSIS — Z23 Encounter for immunization: Secondary | ICD-10-CM | POA: Diagnosis not present

## 2023-04-14 DIAGNOSIS — Z1211 Encounter for screening for malignant neoplasm of colon: Secondary | ICD-10-CM | POA: Diagnosis not present

## 2023-04-14 DIAGNOSIS — Z0001 Encounter for general adult medical examination with abnormal findings: Secondary | ICD-10-CM | POA: Diagnosis not present

## 2023-04-14 DIAGNOSIS — I739 Peripheral vascular disease, unspecified: Secondary | ICD-10-CM | POA: Diagnosis not present

## 2023-04-14 DIAGNOSIS — R8290 Unspecified abnormal findings in urine: Secondary | ICD-10-CM | POA: Diagnosis not present

## 2023-04-14 DIAGNOSIS — Z1329 Encounter for screening for other suspected endocrine disorder: Secondary | ICD-10-CM | POA: Diagnosis not present

## 2023-05-19 ENCOUNTER — Other Ambulatory Visit (INDEPENDENT_AMBULATORY_CARE_PROVIDER_SITE_OTHER): Payer: Medicare Other

## 2023-05-19 ENCOUNTER — Ambulatory Visit: Payer: Medicare Other | Admitting: Internal Medicine

## 2023-05-19 ENCOUNTER — Encounter: Payer: Self-pay | Admitting: Internal Medicine

## 2023-05-19 VITALS — BP 130/80 | HR 51 | Ht 61.0 in | Wt 207.0 lb

## 2023-05-19 DIAGNOSIS — Z862 Personal history of diseases of the blood and blood-forming organs and certain disorders involving the immune mechanism: Secondary | ICD-10-CM | POA: Diagnosis not present

## 2023-05-19 DIAGNOSIS — Z860100 Personal history of colon polyps, unspecified: Secondary | ICD-10-CM

## 2023-05-19 DIAGNOSIS — B181 Chronic viral hepatitis B without delta-agent: Secondary | ICD-10-CM

## 2023-05-19 LAB — HEPATIC FUNCTION PANEL
ALT: 13 U/L (ref 0–35)
AST: 19 U/L (ref 0–37)
Albumin: 3.9 g/dL (ref 3.5–5.2)
Alkaline Phosphatase: 52 U/L (ref 39–117)
Bilirubin, Direct: 0.1 mg/dL (ref 0.0–0.3)
Total Bilirubin: 0.5 mg/dL (ref 0.2–1.2)
Total Protein: 6.4 g/dL (ref 6.0–8.3)

## 2023-05-19 NOTE — Progress Notes (Signed)
Chief Complaint: Hepatitis B infection  HPI : 72 year old female with history of benign neutropenia, chronic hepatitis B, and thrombocytopenia presents for follow up of hepatitis B infection  Interval History: She had one episode of nausea and dizziness in the morning for which she took meclizine to help with this. Denies swelling in abdomen or legs, confusion, blood in the stools, jaundice, or scleral icterus. Denies alcohol use. She has lost a few lbs over time. She is eating and drinking well.   Wt Readings from Last 3 Encounters:  05/19/23 207 lb (93.9 kg)  02/05/23 209 lb (94.8 kg)  11/11/22 208 lb 12.8 oz (94.7 kg)   Current Outpatient Medications  Medication Sig Dispense Refill   atorvastatin (LIPITOR) 10 MG tablet Take 5 mg by mouth daily.     Calcium Carbonate-Vitamin D (CALTRATE 600+D PO) Take 600 mg by mouth in the morning and at bedtime.     meclizine (ANTIVERT) 12.5 MG tablet Take 1 tablet (12.5 mg total) by mouth 3 (three) times daily as needed for dizziness. 30 tablet 0   ondansetron (ZOFRAN-ODT) 4 MG disintegrating tablet Take 1 tablet (4 mg total) by mouth every 8 (eight) hours as needed for nausea or vomiting. 20 tablet 0   No current facility-administered medications for this visit.   Physical Exam: BP 130/80   Pulse (!) 51   Ht 5\' 1"  (1.549 m)   Wt 207 lb (93.9 kg)   BMI 39.11 kg/m  Constitutional: Pleasant,well-developed, female in no acute distress. HEENT: Normocephalic and atraumatic. Conjunctivae are normal. No scleral icterus. Cardiovascular: Normal rate, regular rhythm.  Pulmonary/chest: Effort normal and breath sounds normal. No wheezing, rales or rhonchi. Abdominal: Soft, nondistended, nontender. Bowel sounds active throughout. There are no masses palpable. No hepatomegaly. Extremities: No edema Neurological: No asterixis Skin: Skin is warm and dry. No rashes noted. Psychiatric: Normal mood and affect. Behavior is normal.  Labs 01/2019: CBC with low  WBC of 2.9 and mildly low plts of 145.  Labs 05/13/22: CBC with low WBC of 3.5. CMP unremarkable. Hep B surface antigen reactive. Hep B surface antibody NR. HCV Ab NR. HIV NR  Labs 06/04/22: Hepatitis B DNA 1100. Hepatitis B core antibody positive.  Labs 06/2022: Iron levels nml. HDV RNA not detected. Hepatitis A antibody reactive. Hepatitis B E antigen NR. Hepatitis B E antibody reactive. Hepatitis D antibody positive. INR mildly elevated at 1.1.   Labs 10/2022: LFTs nml. HBV DNA level 813.  Labs 11/2022: CBC with low plts of 139 and low WBC of 3.1  Labs 01/2023: LFTs nml with ALT of 13. HBV DNA 385.   Abd U/S with elastography 06/17/22: IMPRESSION: ULTRASOUND ABDOMEN: Echogenic liver, question fatty infiltration versus cirrhosis. Inadequate visualization of IVC and pancreas. ULTRASOUND HEPATIC ELASTOGRAPHY: Median kPa:  5.1 Diagnostic category: < or = 9 kPa: in the absence of other known clinical signs, rules out cACLD  Colonoscopy 06/22/17:  No path report available Rec: 5 year follow up  Colonoscopy 07/24/22:  Path: 1. Surgical [P], colon, transverse and cecum, polyp (3) TUBULAR ADENOMA, 4 FRAGMENTS NEGATIVE FOR HIGH-GRADE DYSPLASIA AND CARCINOMA 2. Surgical [P], colon, rectal lesion biopsies SQUAMOCOLONIC JUNCTIONAL TYPE MUCOSA WITH FOCAL REACTIVE/PROLAPSE CHANGES NEGATIVE FOR DYSPLASIA AND CARCINOMA  ASSESSMENT AND PLAN: Chronic hepatitis B History of thrombocytopenia History of colon polyp Patient's chronic hepatitis B is likely in an inactive carrier state. Will recheck her labs today. If labs today still show that she is in an active carrier state, then  will space out her visit to 6 months. Her most recent CBC did show some thrombocytopenia so will need to monitor this over time. Her last elastography showed no signs of advanced fibrosis. We will get her up-to-date with her [facility] screening and consider fibrosis re-staging at her next visit.  - Check HBV DNA and LFTs  today - Will get RUQ U/S for HCC screening - At next visit, could consider re-evaluation of fibrosis stage with elastography or liver biopsy - Next colonoscopy in 07/2025 for history of polyps - RTC 6 months  Eulah Pont, MD  I spent 30 minutes of time, including in depth chart review, independent review of results as outlined above, communicating results with the patient directly, face-to-face time with the patient, coordinating care, and ordering studies and medications as appropriate, and documentation.

## 2023-05-19 NOTE — Patient Instructions (Addendum)
Your provider has requested that you go to the basement level for lab work before leaving today. Press "B" on the elevator. The lab is located at the first door on the left as you exit the elevator.  You have been scheduled for an abdominal ultrasound  at [facility] (1st floor of hospital) on 05/21/23 at 9:30 am. Please arrive 30 minutes prior to your appointment for registration. Make certain not to have anything to eat or drink after midnight. Should you need to reschedule your appointment, please contact radiology at [phone]. This test typically takes about 30 minutes to perform.  Follow up in 6 months  If your blood pressure at your visit was 140/90 or greater, please contact your primary care physician to follow up on this.  _______________________________________________________  If you are age 21 or older, your body mass index should be between 23-30. Your Body mass index is 39.11 kg/m. If this is out of the aforementioned range listed, please consider follow up with your Primary Care Provider.  If you are age 73 or younger, your body mass index should be between 19-25. Your Body mass index is 39.11 kg/m. If this is out of the aformentioned range listed, please consider follow up with your Primary Care Provider.   ________________________________________________________  The [facility] providers would like to encourage you to use [facility] to communicate with providers for non-urgent requests or questions.  Due to long hold times on the telephone, sending your provider a message by [facility] may be a faster and more efficient way to get a response.  Please allow 48 business hours for a response.  Please remember that this is for non-urgent requests.  _______________________________________________________  Thank you for entrusting me with your care and for choosing [facility], Dr. Eulah Pont

## 2023-05-21 ENCOUNTER — Ambulatory Visit (HOSPITAL_COMMUNITY)
Admission: RE | Admit: 2023-05-21 | Discharge: 2023-05-21 | Disposition: A | Discharge status: Home / Self Care | Payer: Medicare Other | Source: Ambulatory Visit | Attending: Internal Medicine | Admitting: Internal Medicine

## 2023-05-21 DIAGNOSIS — B181 Chronic viral hepatitis B without delta-agent: Secondary | ICD-10-CM

## 2023-05-21 DIAGNOSIS — Z862 Personal history of diseases of the blood and blood-forming organs and certain disorders involving the immune mechanism: Secondary | ICD-10-CM

## 2023-05-21 DIAGNOSIS — K7689 Other specified diseases of liver: Secondary | ICD-10-CM | POA: Diagnosis not present

## 2023-05-21 LAB — HEPATITIS B DNA, ULTRAQUANTITATIVE, PCR
Hepatitis B DNA: 2260 [IU]/mL — ABNORMAL HIGH
Hepatitis B virus DNA: 3.35 {Log_IU}/mL — ABNORMAL HIGH

## 2023-05-21 NOTE — Progress Notes (Signed)
Hi Jessica Combs, please let the patient know that although her liver labs remain normal, her viral levels of hepatitis B have increased. This can sometimes suggest that she has signs if active inflammation. I would recommend that we get a liver biopsy to determine if she needs to be started on hepatitis B treatment. Please get her scheduled for this if she is amenable.

## 2023-05-31 ENCOUNTER — Other Ambulatory Visit: Payer: Self-pay

## 2023-05-31 DIAGNOSIS — B181 Chronic viral hepatitis B without delta-agent: Secondary | ICD-10-CM

## 2023-06-17 ENCOUNTER — Other Ambulatory Visit (HOSPITAL_COMMUNITY): Payer: Self-pay | Admitting: Student

## 2023-06-17 DIAGNOSIS — B181 Chronic viral hepatitis B without delta-agent: Secondary | ICD-10-CM

## 2023-06-20 NOTE — Progress Notes (Signed)
Chief Complaint: Chronic hepatitis B  Referring Physician(s): Dorsey,Ying C  Supervising Physician: Roanna Banning  Patient Status: [facility] - Out-pt  History of Present Illness: Jessica Combs is a 72 y.o. female with PMH sig for arthritis, osteopenia, colon polyp, neutropenia/thrombocytopenia and chronic hepatitis B. Recent labs show increasing hep B viral levels and pt presents today for US guided random liver biopsy for further evaluation.   Past Medical History:  Diagnosis Date   Arthritis of knee    Bradycardia    Bronchitis    Hepatitis B    Osteopenia 01/27/2017   Vaginitis     Past Surgical History:  Procedure Laterality Date   BREAST BIOPSY Right    COLONOSCOPY      Allergies: Pravachol [pravastatin]  Medications: Prior to Admission medications   Medication Sig Start Date End Date Taking? Authorizing Provider  atorvastatin (LIPITOR) 10 MG tablet Take 5 mg by mouth daily. 05/27/22   [provider]  Calcium Carbonate-Vitamin D (CALTRATE 600+D PO) Take 600 mg by mouth in the morning and at bedtime.    [provider]  meclizine (ANTIVERT) 12.5 MG tablet Take 1 tablet (12.5 mg total) by mouth 3 (three) times daily as needed for dizziness. 03/08/23   Valinda Hoar, NP  ondansetron (ZOFRAN-ODT) 4 MG disintegrating tablet Take 1 tablet (4 mg total) by mouth every 8 (eight) hours as needed for nausea or vomiting. 03/08/23   Valinda Hoar, NP     Family History  Problem Relation Age of Onset   Colon cancer Neg Hx    Rectal cancer Neg Hx    Esophageal cancer Neg Hx    Stomach cancer Neg Hx    Breast cancer Neg Hx     Social History   Socioeconomic History   Marital status: Married    Spouse name: Not on file   Number of children: 4   Years of education: Not on file   Highest education level: Not on file  Occupational History   Not on file  Tobacco Use   Smoking status: Never   Smokeless tobacco: Never  Vaping Use    Vaping status: Never Used  Substance and Sexual Activity   Alcohol use: No   Drug use: No   Sexual activity: Yes    Partners: Male    Birth control/protection: Post-menopausal  Other Topics Concern   Not on file  Social History Narrative   Diet: Regular      Do you drink/ eat things with caffeine?Yes ,In small ammounts      Marital status: Married                              What year were you married ? 1976      Do you live in a house, apartment,assistred living, condo, trailer, etc.)? House      Is it one or more stories? One story      How many persons live in your home ?  2 people      Do you have any pets in your home ?(please list) No      Current or past profession: Certified Nursing Assistant      Do you exercise?  Yes                            Type & how often: Walking ( every day)  Do you have a living will? No      Do you have a DNR form?  No                     If not, do you want to discuss one? Yes      Do you have signed POA?HPOA forms?  No               If so, please bring to your        appointment      Social Determinants of Health   Financial Resource Strain: Not on file  Food Insecurity: Not on file  Transportation Needs: Not on file  Physical Activity: Not on file  Stress: Not on file  Social Connections: Not on file      Review of Systems: denies fever,HA,CP,dyspnea, cough, abd/back pain,N/V or bleeding  Vital Signs: Vitals:   06/21/23 1116 06/21/23 1132  BP: (!) 172/88 (!) 162/91  Pulse: (!) 54   Resp: 18   Temp: 98.6 F (37 C)   SpO2: 100%       Code Status: FULL CODE  Advance Care Plan: no documents on file    Physical Exam: awake/alert; chest- CTA bilat; heart- sl bradycardic but reg rhythm; abd-soft,+BS,NT; trace pretibial edema bilat  Imaging: No results found.  Labs:  CBC: Recent Labs    11/11/22 1443  WBC 3.1*  HGB 12.5  HCT 37.5  PLT 139*    COAGS: No results for input(s): "INR", "APTT" in the  last 8760 hours.  BMP: Recent Labs    11/11/22 1443  NA 140  K 3.5  CL 105  CO2 27  GLUCOSE 84  BUN 16  CALCIUM 9.1  CREATININE 0.91  GFRNONAA >60    LIVER FUNCTION TESTS: Recent Labs    11/03/22 1514 11/11/22 1443 02/05/23 0851 05/19/23 0943  BILITOT 0.5 0.8 0.6 0.5  AST 19 23 18 19   ALT 16 19 13 13   ALKPHOS 53 49 51 52  PROT 7.2 7.1 6.9 6.4  ALBUMIN 4.2 4.1 3.9 3.9    TUMOR MARKERS: No results for input(s): "AFPTM", "CEA", "CA199", "CHROMGRNA" in the last 8760 hours.  Assessment and Plan:  72 y.o. female with PMH sig for arthritis, osteopenia, colon polyp, neutropenia/thrombocytopenia and chronic hepatitis B. Recent labs show increasing hep B viral levels and pt presents today for US guided random liver biopsy for further evaluation. Risks and benefits of procedure was discussed with the patient and/or patient's family including, but not limited to bleeding, infection, damage to adjacent structures or low yield requiring additional tests.  All of the questions were answered and there is agreement to proceed.  Consent signed and in chart.    Thank you for this interesting consult.  I greatly enjoyed meeting Jessica Combs and look forward to participating in their care.  A copy of this report was sent to the requesting provider on this date.  Electronically Signed: D. Jeananne Rama, PA-C 06/20/2023, 11:59 AM   I spent a total of 25 minutes   in face to face in clinical consultation, greater than 50% of which was counseling/coordinating care for US guided random core liver biopsy

## 2023-06-21 ENCOUNTER — Ambulatory Visit (HOSPITAL_COMMUNITY)
Admission: RE | Admit: 2023-06-21 | Discharge: 2023-06-21 | Disposition: A | Discharge status: Home / Self Care | Payer: Medicare Other | Source: Ambulatory Visit | Attending: Internal Medicine | Admitting: Internal Medicine

## 2023-06-21 ENCOUNTER — Encounter (HOSPITAL_COMMUNITY): Payer: Self-pay

## 2023-06-21 DIAGNOSIS — M8580 Other specified disorders of bone density and structure, unspecified site: Secondary | ICD-10-CM | POA: Diagnosis not present

## 2023-06-21 DIAGNOSIS — Z860100 Personal history of colon polyps, unspecified: Secondary | ICD-10-CM | POA: Insufficient documentation

## 2023-06-21 DIAGNOSIS — K759 Inflammatory liver disease, unspecified: Secondary | ICD-10-CM | POA: Diagnosis not present

## 2023-06-21 DIAGNOSIS — K760 Fatty (change of) liver, not elsewhere classified: Secondary | ICD-10-CM | POA: Diagnosis not present

## 2023-06-21 DIAGNOSIS — B181 Chronic viral hepatitis B without delta-agent: Secondary | ICD-10-CM | POA: Insufficient documentation

## 2023-06-21 LAB — CBC
HCT: 39.8 % (ref 36.0–46.0)
Hemoglobin: 12.8 g/dL (ref 12.0–15.0)
MCH: 30.8 pg (ref 26.0–34.0)
MCHC: 32.2 g/dL (ref 30.0–36.0)
MCV: 95.9 fL (ref 80.0–100.0)
Platelets: 134 10*3/uL — ABNORMAL LOW (ref 150–400)
RBC: 4.15 MIL/uL (ref 3.87–5.11)
RDW: 13.7 % (ref 11.5–15.5)
WBC: 3.7 10*3/uL — ABNORMAL LOW (ref 4.0–10.5)
nRBC: 0 % (ref 0.0–0.2)

## 2023-06-21 LAB — PROTIME-INR
INR: 1.1 (ref 0.8–1.2)
Prothrombin Time: 13.9 s (ref 11.4–15.2)

## 2023-06-21 MED ORDER — FENTANYL CITRATE (PF) 100 MCG/2ML IJ SOLN
INTRAMUSCULAR | Status: AC | PRN
  Administered 2023-06-21: 50 ug via INTRAVENOUS

## 2023-06-21 MED ORDER — GELATIN ABSORBABLE 12-7 MM EX MISC
CUTANEOUS | Status: AC | PRN
  Administered 2023-06-21: 1 via TOPICAL

## 2023-06-21 MED ORDER — FENTANYL CITRATE (PF) 100 MCG/2ML IJ SOLN
INTRAMUSCULAR | Status: AC
  Filled 2023-06-21: qty 2

## 2023-06-21 MED ORDER — MIDAZOLAM HCL 2 MG/2ML IJ SOLN
INTRAMUSCULAR | Status: AC
  Filled 2023-06-21: qty 2

## 2023-06-21 MED ORDER — OXYCODONE HCL 5 MG PO TABS: 10.0000 mg | ORAL_TABLET | Freq: Four times a day (QID) | ORAL | Status: DC | PRN

## 2023-06-21 MED ORDER — GELATIN ABSORBABLE 12-7 MM EX MISC
CUTANEOUS | Status: AC
  Filled 2023-06-21: qty 1

## 2023-06-21 MED ORDER — MIDAZOLAM HCL 2 MG/2ML IJ SOLN
INTRAMUSCULAR | Status: AC | PRN
  Administered 2023-06-21: 1 mg via INTRAVENOUS

## 2023-06-21 MED ORDER — SODIUM CHLORIDE 0.9 % IV SOLN: INTRAVENOUS | Status: DC

## 2023-06-21 MED ORDER — MIDAZOLAM HCL 2 MG/2ML IJ SOLN
INTRAMUSCULAR | Status: AC | PRN
Start: 2023-06-21 — End: 2023-06-21
  Administered 2023-06-21: 1 mg via INTRAVENOUS

## 2023-06-21 MED ORDER — LIDOCAINE HCL 1 % IJ SOLN
INTRAMUSCULAR | Status: AC
  Filled 2023-06-21: qty 20

## 2023-06-21 NOTE — Discharge Instructions (Signed)
Liver Biopsy, Care After  May remove dressing or bandaid and shower tomorrow.  Keep site clean and dry. Replace with clean dressing or bandaid as necessary.   Urgent needs - Interventional Radiology clinic [phone]  After a liver biopsy, it is common to have these things in the area where the biopsy was done. You may: Have pain. Feel sore. Have bruising. You may also feel tired for a few days. Follow these instructions at home: Medicines Take over-the-counter and prescription medicines only as told by your doctor. If you were prescribed an antibiotic medicine, take it as told by your doctor. Do not stop taking the antibiotic, even if you start to feel better. Do not take medicines that may thin your blood. These medicines include aspirin and ibuprofen. Take them only if your doctor tells you to. If told, take steps to prevent problems with pooping (constipation). You may need to: Drink enough fluid to keep your pee (urine) pale yellow. Take medicines. You will be told what medicines to take. Eat foods that are high in fiber. These include beans, whole grains, and fresh fruits and vegetables. Limit foods that are high in fat and sugar. These include fried or sweet foods. Ask your doctor if you should avoid driving or using machines while you are taking your medicine. Caring for your incision Follow instructions from your doctor about how to take care of your cut from surgery (incisions). Make sure you: Wash your hands with soap and water for at least 20 seconds before and after you change your bandage. If you cannot use soap and water, use hand sanitizer. Change your bandage. Leavestitches or skin glue in place for at least two weeks. Leave tape strips alone unless you are told to take them off. You may trim the edges of the tape strips if they curl up. Check your incision every day for signs of infection. Check for: Redness, swelling, or more pain. Fluid or blood. Warmth. Pus or a  bad smell. Do not take baths, swim, or use a hot tub. Ask your doctor about taking showers or sponge baths. Activity Rest at home for 1-2 days, or as told by your doctor. Get up to take short walks every 1 to 2 hours. Ask for help if you feel weak or unsteady. Do not lift anything that is heavier than 10 lb (4.5 kg), or the limit that you are told. Do not play contact sports for 2 weeks after the procedure. Return to your normal activities as told by your doctor. Ask what activities are safe for you. General instructions Do not drink alcohol in the first week after the procedure. Plan to have a responsible adult care for you for the time you are told after you leave the hospital or clinic. This is important. It is up to you to get the results of your procedure. Ask how to get your results when they are ready. Keep all follow-up visits.   Contact a doctor if: You have more bleeding in your incision. Your incision swells, or is red and more painful. You have fluid that comes from your incision. You develop a rash. You have fever or chills. Get help right away if: You have swelling, bloating, or pain in your belly (abdomen). You get dizzy or faint. You vomit or you feel like vomiting. You have trouble breathing or feel short of breath. You have chest pain. You have problems talking or seeing. You have trouble with your balance or moving your arms or   legs. These symptoms may be an emergency. Get help right away. Call your local emergency services (911 in the [place] Do not wait to see if the symptoms will go away. Do not drive yourself to the hospital. Summary After the procedure, it is common to have pain, soreness, bruising, and tiredness. Your doctor will tell you how to take care of yourself at home. Change your bandage, take your medicines, and limit your activities as told by your doctor. Call your doctor if you have symptoms of infection. Get help right away if your belly swells,  your cut bleeds a lot, or you have trouble talking or breathing. This information is not intended to replace advice given to you by your health care provider. Make sure you discuss any questions you have with your healthcare provider. Document Revised: 06/10/2020 Document Reviewed: 06/10/2020 Elsevier Patient Education  2022 Elsevier Inc.  Moderate Conscious Sedation, Adult, Care After This sheet gives you information about how to care for yourself after your procedure. Your health care provider may also give you more specific instructions. If you have problems or questions, contact your health careprovider. What can I expect after the procedure? After the procedure, it is common to have: Sleepiness for several hours. Impaired judgment for several hours. Difficulty with balance. Vomiting if you eat too soon. Follow these instructions at home: For the time period you were told by your health care provider: Rest. Do not participate in activities where you could fall or become injured. Do not drive or use machinery. Do not drink alcohol. Do not take sleeping pills or medicines that cause drowsiness. Do not make important decisions or sign legal documents. Do not take care of children on your own. Eating and drinking  Follow the diet recommended by your health care provider. Drink enough fluid to keep your urine pale yellow. If you vomit: Drink water, juice, or soup when you can drink without vomiting. Make sure you have little or no nausea before eating solid foods.  General instructions Take over-the-counter and prescription medicines only as told by your health care provider. Have a responsible adult stay with you for the time you are told. It is important to have someone help care for you until you are awake and alert. Do not smoke. Keep all follow-up visits as told by your health care provider. This is important. Contact a health care provider if: You are still sleepy or having  trouble with balance after 24 hours. You feel light-headed. You keep feeling nauseous or you keep vomiting. You develop a rash. You have a fever. You have redness or swelling around the IV site. Get help right away if: You have trouble breathing. You have new-onset confusion at home. Summary After the procedure, it is common to feel sleepy, have impaired judgment, or feel nauseous if you eat too soon. Rest after you get home. Know the things you should not do after the procedure. Follow the diet recommended by your health care provider and drink enough fluid to keep your urine pale yellow. Get help right away if you have trouble breathing or new-onset confusion at home. This information is not intended to replace advice given to you by your health care provider. Make sure you discuss any questions you have with your healthcare provider. Document Revised: 11/24/2019 Document Reviewed: 06/22/2019 Elsevier Patient Education  2022 Elsevier Inc.        

## 2023-06-21 NOTE — Procedures (Signed)
Vascular and Interventional Radiology Procedure Note  Patient: Jessica Combs DOB: June 24, 1951 Medical Record Number: [id number] Note Date/Time: 06/21/23 1:24 PM   Performing Physician: Roanna Banning, MD Assistant(s): None  Diagnosis: HBV  Procedure: LIVER BIOPSY, NON TARGETED   Anesthesia: Conscious Sedation Complications: None Estimated Blood Loss: Minimal Specimens: Sent for Pathology  Findings:  Successful Ultrasound-guided biopsy of liver. A total of 4 samples were obtained. Hemostasis of the tract was achieved using Gelfoam Slurry Embolization.  Plan: Bed rest for 2 hours.  See detailed procedure note with images in PACS. The patient tolerated the procedure well without incident or complication and was returned to Recovery in stable condition.    Roanna Banning, MD [facility]   Pager. [phone] Clinic. [phone]

## 2023-07-02 LAB — SURGICAL PATHOLOGY

## 2023-07-05 ENCOUNTER — Encounter (HOSPITAL_COMMUNITY): Payer: Self-pay

## 2023-08-02 ENCOUNTER — Other Ambulatory Visit: Payer: Self-pay | Admitting: Internal Medicine

## 2023-08-02 DIAGNOSIS — Z1231 Encounter for screening mammogram for malignant neoplasm of breast: Secondary | ICD-10-CM

## 2023-08-10 DIAGNOSIS — E785 Hyperlipidemia, unspecified: Secondary | ICD-10-CM | POA: Diagnosis not present

## 2023-08-10 DIAGNOSIS — R030 Elevated blood-pressure reading, without diagnosis of hypertension: Secondary | ICD-10-CM | POA: Diagnosis not present

## 2023-08-10 DIAGNOSIS — J3089 Other allergic rhinitis: Secondary | ICD-10-CM | POA: Diagnosis not present

## 2023-08-10 DIAGNOSIS — R768 Other specified abnormal immunological findings in serum: Secondary | ICD-10-CM | POA: Diagnosis not present

## 2023-08-10 DIAGNOSIS — R7303 Prediabetes: Secondary | ICD-10-CM | POA: Diagnosis not present

## 2023-08-10 DIAGNOSIS — D696 Thrombocytopenia, unspecified: Secondary | ICD-10-CM | POA: Diagnosis not present

## 2023-08-25 ENCOUNTER — Ambulatory Visit
Admission: RE | Admit: 2023-08-25 | Discharge: 2023-08-25 | Disposition: A | Discharge status: Home / Self Care | Payer: Medicare Other | Source: Ambulatory Visit | Attending: Internal Medicine | Admitting: Internal Medicine

## 2023-08-25 DIAGNOSIS — Z1231 Encounter for screening mammogram for malignant neoplasm of breast: Secondary | ICD-10-CM

## 2023-08-27 ENCOUNTER — Other Ambulatory Visit: Payer: Self-pay | Admitting: Internal Medicine

## 2023-08-27 DIAGNOSIS — R928 Other abnormal and inconclusive findings on diagnostic imaging of breast: Secondary | ICD-10-CM

## 2023-09-04 ENCOUNTER — Ambulatory Visit
Admission: RE | Admit: 2023-09-04 | Discharge: 2023-09-04 | Disposition: A | Discharge status: Home / Self Care | Payer: Medicare Other | Source: Ambulatory Visit | Attending: Internal Medicine | Admitting: Internal Medicine

## 2023-09-04 DIAGNOSIS — R928 Other abnormal and inconclusive findings on diagnostic imaging of breast: Secondary | ICD-10-CM

## 2023-09-04 DIAGNOSIS — N6012 Diffuse cystic mastopathy of left breast: Secondary | ICD-10-CM | POA: Diagnosis not present

## 2023-09-04 DIAGNOSIS — N6322 Unspecified lump in the left breast, upper inner quadrant: Secondary | ICD-10-CM | POA: Diagnosis not present

## 2023-09-10 ENCOUNTER — Encounter: Payer: Self-pay | Admitting: Internal Medicine

## 2023-09-15 ENCOUNTER — Other Ambulatory Visit: Payer: Medicare Other

## 2023-10-18 ENCOUNTER — Telehealth: Payer: Self-pay | Admitting: Hematology and Oncology

## 2023-11-10 ENCOUNTER — Inpatient Hospital Stay: Attending: Internal Medicine

## 2023-11-10 ENCOUNTER — Inpatient Hospital Stay: Admitting: Hematology and Oncology

## 2023-11-10 ENCOUNTER — Other Ambulatory Visit: Payer: Self-pay | Admitting: Hematology and Oncology

## 2023-11-10 VITALS — BP 159/91 | HR 52 | Temp 97.4°F | Resp 16 | Wt 203.9 lb

## 2023-11-10 DIAGNOSIS — D709 Neutropenia, unspecified: Secondary | ICD-10-CM | POA: Diagnosis not present

## 2023-11-10 DIAGNOSIS — D696 Thrombocytopenia, unspecified: Secondary | ICD-10-CM | POA: Diagnosis not present

## 2023-11-10 DIAGNOSIS — H40013 Open angle with borderline findings, low risk, bilateral: Secondary | ICD-10-CM | POA: Diagnosis not present

## 2023-11-10 DIAGNOSIS — D708 Other neutropenia: Secondary | ICD-10-CM

## 2023-11-10 DIAGNOSIS — B181 Chronic viral hepatitis B without delta-agent: Secondary | ICD-10-CM | POA: Diagnosis not present

## 2023-11-10 LAB — CMP (CANCER CENTER ONLY)
ALT: 15 U/L (ref 0–44)
AST: 21 U/L (ref 15–41)
Albumin: 4.3 g/dL (ref 3.5–5.0)
Alkaline Phosphatase: 57 U/L (ref 38–126)
Anion gap: 5 (ref 5–15)
BUN: 11 mg/dL (ref 8–23)
CO2: 29 mmol/L (ref 22–32)
Calcium: 9.5 mg/dL (ref 8.9–10.3)
Chloride: 109 mmol/L (ref 98–111)
Creatinine: 0.87 mg/dL (ref 0.44–1.00)
GFR, Estimated: >60 mL/min (ref >60)
Glucose, Bld: 89 mg/dL (ref 70–99)
Potassium: 4 mmol/L (ref 3.5–5.1)
Sodium: 143 mmol/L (ref 135–145)
Total Bilirubin: 0.6 mg/dL (ref 0.0–1.2)
Total Protein: 7.1 g/dL (ref 6.5–8.1)

## 2023-11-10 LAB — CBC WITH DIFFERENTIAL (CANCER CENTER ONLY)
Abs Immature Granulocytes: 0 10*3/uL (ref 0.00–0.07)
Basophils Absolute: 0 10*3/uL (ref 0.0–0.1)
Basophils Relative: 1 %
Eosinophils Absolute: 0.1 10*3/uL (ref 0.0–0.5)
Eosinophils Relative: 4 %
HCT: 39.2 % (ref 36.0–46.0)
Hemoglobin: 12.9 g/dL (ref 12.0–15.0)
Immature Granulocytes: 0 %
Lymphocytes Relative: 51 %
Lymphs Abs: 1.4 10*3/uL (ref 0.7–4.0)
MCH: 30.8 pg (ref 26.0–34.0)
MCHC: 32.9 g/dL (ref 30.0–36.0)
MCV: 93.6 fL (ref 80.0–100.0)
Monocytes Absolute: 0.2 10*3/uL (ref 0.1–1.0)
Monocytes Relative: 9 %
Neutro Abs: 1 10*3/uL — ABNORMAL LOW (ref 1.7–7.7)
Neutrophils Relative %: 35 %
Platelet Count: 132 10*3/uL — ABNORMAL LOW (ref 150–400)
RBC: 4.19 MIL/uL (ref 3.87–5.11)
RDW: 13.5 % (ref 11.5–15.5)
WBC Count: 2.8 10*3/uL — ABNORMAL LOW (ref 4.0–10.5)
nRBC: 0 % (ref 0.0–0.2)

## 2023-11-10 LAB — LACTATE DEHYDROGENASE: LDH: 164 U/L (ref 98–192)

## 2023-11-10 NOTE — Progress Notes (Signed)
 [facility] [phone]   Fax:([phone]  PROGRESS NOTE  Patient Care Team: [place], MD as PCP - General (Internal Medicine)  CHIEF COMPLAINTS/PURPOSE OF CONSULTATION:  Thrombocytopenia Neutropenia  HISTORY OF PRESENTING ILLNESS:  Jessica Combs 73 y.o. female who returns to the clinic for a follow up for thrombocytopenia and neutropenia.  She is unaccompanied for this visit.  On exam today, Jessica Combs reports she has been doing well overall in the interim since her last visit.  She reports he is not having any issues with seasonal allergies.  She has had no recent viral illnesses such as flu or norovirus.  She is also had no changes in her health in the last several months.  She reports that she is not having any bleeding, bruising, or dark stools.  Her appetite is strong and she reports her energy is an 8-10.  She has had no lightheadedness, dizziness, shortness of breath.  She has had no recent infectious symptoms.  She notes that her blood pressure is elevated but that tends to happen when she is at the doctor's office.  She took her blood pressure this morning and was 128/83.  Overall she is at her baseline level of health with no questions concerns or complaints today. She denies fevers, chills, night sweats, shortness of breath, chest pain or cough.  She has no other complaints.  Rest of 10 point ROS is below.  MEDICAL HISTORY:  Past Medical History:  Diagnosis Date   Arthritis of knee    Bradycardia    Bronchitis    Hepatitis B    Osteopenia 01/27/2017   Vaginitis     SURGICAL HISTORY: Past Surgical History:  Procedure Laterality Date   BREAST BIOPSY Right 09/25/2011   x 2 both benign   COLONOSCOPY      SOCIAL HISTORY: Social History   Socioeconomic History   Marital status: Married    Spouse name: Not on file   Number of children: 4   Years of education: Not on file   Highest education level: Not on file   Occupational History   Not on file  Tobacco Use   Smoking status: Never   Smokeless tobacco: Never  Vaping Use   Vaping status: Never Used  Substance and Sexual Activity   Alcohol use: No   Drug use: No   Sexual activity: Yes    Partners: Male    Birth control/protection: Post-menopausal  Other Topics Concern   Not on file  Social History Narrative   Diet: Regular      Do you drink/ eat things with caffeine?Yes ,In small ammounts      Marital status: Married                              What year were you married ? 1976      Do you live in a house, apartment,assistred living, condo, trailer, etc.)? House      Is it one or more stories? One story      How many persons live in your home ?  2 people      Do you have any pets in your home ?(please list) No      Current or past profession: Certified Nursing Assistant      Do you exercise?  Yes  Type & how often: Walking ( every day)      Do you have a living will? No      Do you have a DNR form?  No                     If not, do you want to discuss one? Yes      Do you have signed POA?HPOA forms?  No               If so, please bring to your        appointment      Social Drivers of Health   Financial Resource Strain: Not on file  Food Insecurity: Not on file  Transportation Needs: Not on file  Physical Activity: Not on file  Stress: Not on file  Social Connections: Not on file  Intimate Partner Violence: Not on file    FAMILY HISTORY: Family History  Problem Relation Age of Onset   Colon cancer Neg Hx    Rectal cancer Neg Hx    Esophageal cancer Neg Hx    Stomach cancer Neg Hx    Breast cancer Neg Hx    BRCA 1/2 Neg Hx     ALLERGIES:  is allergic to pravachol [pravastatin].  MEDICATIONS:  Current Outpatient Medications  Medication Sig Dispense Refill   atorvastatin (LIPITOR) 10 MG tablet Take 5 mg by mouth daily.     Calcium Carbonate-Vitamin D (CALTRATE 600+D PO) Take  600 mg by mouth in the morning and at bedtime.     meclizine (ANTIVERT) 12.5 MG tablet Take 1 tablet (12.5 mg total) by mouth 3 (three) times daily as needed for dizziness. 30 tablet 0   ondansetron (ZOFRAN-ODT) 4 MG disintegrating tablet Take 1 tablet (4 mg total) by mouth every 8 (eight) hours as needed for nausea or vomiting. 20 tablet 0   No current facility-administered medications for this visit.    REVIEW OF SYSTEMS:   Constitutional: ( - ) fevers, ( - )  chills , ( - ) night sweats Eyes: ( - ) blurriness of vision, ( - ) double vision, ( - ) watery eyes Ears, nose, mouth, throat, and face: ( - ) mucositis, ( - ) sore throat Respiratory: ( - ) cough, ( - ) dyspnea, ( - ) wheezes Cardiovascular: ( - ) palpitation, ( - ) chest discomfort, ( - ) lower extremity swelling Gastrointestinal:  ( - ) nausea, ( - ) heartburn, ( - ) change in bowel habits Skin: ( - ) abnormal skin rashes Lymphatics: ( - ) new lymphadenopathy, ( - ) easy bruising Neurological: ( - ) numbness, ( - ) tingling, ( - ) new weaknesses Behavioral/Psych: ( - ) mood change, ( - ) new changes  All other systems were reviewed with the patient and are negative.  PHYSICAL EXAMINATION: ECOG PERFORMANCE STATUS: 0 - Asymptomatic  There were no vitals filed for this visit.  There were no vitals filed for this visit.   GENERAL: well appearing female in NAD  SKIN: skin color, texture, turgor are normal, no rashes or significant lesions EYES: conjunctiva are pink and non-injected, sclera clear LUNGS: clear to auscultation and percussion with normal breathing effort HEART: regular rate & rhythm and no murmurs and no lower extremity edema Musculoskeletal: no cyanosis of digits and no clubbing  PSYCH: alert & oriented x 3, fluent speech NEURO: no focal motor/sensory deficits  LABORATORY DATA:  I have reviewed the data  as listed    Latest Ref Rng & Units 06/21/2023   11:45 AM 11/11/2022    2:43 PM 05/13/2022    3:22 PM   CBC  WBC 4.0 - 10.5 K/uL 3.7  3.1  3.5   Hemoglobin 12.0 - 15.0 g/dL 16.1  09.6  04.5   Hematocrit 36.0 - 46.0 % 39.8  37.5  37.6   Platelets 150 - 400 K/uL 134  139  170        Latest Ref Rng & Units 05/19/2023    9:43 AM 02/05/2023    8:51 AM 11/11/2022    2:43 PM  CMP  Glucose 70 - 99 mg/dL   84   BUN 8 - 23 mg/dL   16   Creatinine 4.09 - 1.00 mg/dL   8.11   Sodium 914 - 782 mmol/L   140   Potassium 3.5 - 5.1 mmol/L   3.5   Chloride 98 - 111 mmol/L   105   CO2 22 - 32 mmol/L   27   Calcium 8.9 - 10.3 mg/dL   9.1   Total Protein 6.0 - 8.3 g/dL 6.4  6.9  7.1   Total Bilirubin 0.2 - 1.2 mg/dL 0.5  0.6  0.8   Alkaline Phos 39 - 117 U/L 52  51  49   AST 0 - 37 U/L 19  18  23    ALT 0 - 35 U/L 13  13  19     ASSESSMENT & PLAN Jessica Combs is a 73 y.o. female returns for a follow up for neutropenia and thrombocytopenia.    #Thrombocytopenia/Neutropenia: --Workup from 05/27/2023 ruled out nutritional deficiencies, paraproteinemia, active hepatitis B/C.  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 --Thrombocytopenia is transient and mild. Abdominal US from 06/17/22 did show signs of fatty liver which can contribute to thrombocytopenia.  --Labs today show WBC 2.8, Hgb 12.9, MCV 93.6, Plt 132  --No further workup required at this time --okay to discharge from clinic and patient can follow up with PCP.   #Chronic hepatitis B: --Most consistent with inactive carrier state --Under the care of GI who is monitoring levels to determine if she needs treatment for hepatitis B in the future.    No orders of the defined types were placed in this encounter.   All questions were answered. The patient knows to call the clinic with any problems, questions or concerns.  I have spent a total of 25 minutes minutes of face-to-face and non-face-to-face time, preparing to see the patient, performing a medically appropriate examination, counseling and educating the  patient, documenting clinical information in the electronic health record, and care coordination.   Ulysees Barns, MD Department of Hematology/Oncology [facility] at [facility] Phone: [phone] Pager: [phone] Email: [email or web address]

## 2023-11-11 ENCOUNTER — Other Ambulatory Visit: Payer: Medicare Other

## 2023-11-11 ENCOUNTER — Ambulatory Visit: Payer: Medicare Other | Admitting: Hematology and Oncology

## 2023-12-08 ENCOUNTER — Other Ambulatory Visit

## 2023-12-08 ENCOUNTER — Encounter: Payer: Self-pay | Admitting: Internal Medicine

## 2023-12-08 ENCOUNTER — Ambulatory Visit: Payer: Medicare Other | Admitting: Internal Medicine

## 2023-12-08 VITALS — BP 120/80 | HR 53 | Ht 61.0 in | Wt 205.6 lb

## 2023-12-08 DIAGNOSIS — Z862 Personal history of diseases of the blood and blood-forming organs and certain disorders involving the immune mechanism: Secondary | ICD-10-CM

## 2023-12-08 DIAGNOSIS — B181 Chronic viral hepatitis B without delta-agent: Secondary | ICD-10-CM | POA: Diagnosis not present

## 2023-12-08 DIAGNOSIS — Z860100 Personal history of colon polyps, unspecified: Secondary | ICD-10-CM

## 2023-12-08 NOTE — Patient Instructions (Signed)
 Your provider has requested that you go to the basement level for lab work before leaving today. Press "B" on the elevator. The lab is located at the first door on the left as you exit the elevator.  You have been scheduled for an abdominal ultrasound at [facility] (1st floor of hospital) on 12/14/23 at 10 am. Please arrive 30 minutes prior to your appointment for registration. Make certain not to have anything to eat or drink 6 hours prior to your appointment. Should you need to reschedule your appointment, please contact radiology at [phone]. This test typically takes about 30 minutes to perform.  Follow up in 6 month  If your blood pressure at your visit was 140/90 or greater, please contact your primary care physician to follow up on this.  _______________________________________________________  If you are age 35 or older, your body mass index should be between 23-30. Your Body mass index is 38.85 kg/m. If this is out of the aforementioned range listed, please consider follow up with your Primary Care Provider.  If you are age 26 or younger, your body mass index should be between 19-25. Your Body mass index is 38.85 kg/m. If this is out of the aformentioned range listed, please consider follow up with your Primary Care Provider.   ________________________________________________________  The [facility] providers would like to encourage you to use MYCHART to communicate with providers for non-urgent requests or questions.  Due to long hold times on the telephone, sending your provider a message by [facility] may be a faster and more efficient way to get a response.  Please allow 48 business hours for a response.  Please remember that this is for non-urgent requests.  _______________________________________________________  Due to recent changes in healthcare laws, you may see the results of your imaging and laboratory studies on MyChart before your provider has had a chance to  review them.  We understand that in some cases there may be results that are confusing or concerning to you. Not all laboratory results come back in the same time frame and the provider may be waiting for multiple results in order to interpret others.  Please give us  48 hours in order for your provider to thoroughly review all the results before contacting the office for clarification of your results.   Thank you for entrusting me with your care and for choosing [facility],  Dr. Regino Caprio

## 2023-12-08 NOTE — Progress Notes (Signed)
 Chief Complaint: Hepatitis B infection  HPI : 73 year old female with history of benign neutropenia, chronic hepatitis B, and thrombocytopenia presents for follow up of hepatitis B infection  Interval History: Patient has been doing well. Denies ab pain. Denies swelling in the abdomen and legs. Denies blood in the stools. She is eating and drinking well. Denies confusion. She is going [place] this Saturday.   Wt Readings from Last 3 Encounters:  12/08/23 205 lb 9.6 oz (93.3 kg)  11/10/23 203 lb 14.4 oz (92.5 kg)  05/19/23 207 lb (93.9 kg)   Current Outpatient Medications  Medication Sig Dispense Refill   atorvastatin  (LIPITOR) 10 MG tablet Take 5 mg by mouth daily.     Calcium  Carbonate-Vitamin D (CALTRATE 600+D PO) Take 600 mg by mouth in the morning and at bedtime.     No current facility-administered medications for this visit.   Physical Exam: BP 120/80   Pulse (!) 53   Ht 5\' 1"  (1.549 m)   Wt 205 lb 9.6 oz (93.3 kg)   BMI 38.85 kg/m  Constitutional: Pleasant,well-developed, female in no acute distress. HEENT: Normocephalic and atraumatic. Conjunctivae are normal. No scleral icterus. Cardiovascular: Mild bradycardia Pulmonary/chest: Effort normal and breath sounds normal Abdominal: Soft, nondistended, nontender. Bowel sounds active throughout. There are no masses palpable. No hepatomegaly. Extremities: No edema Neurological: No asterixis Skin: Skin is warm and dry. No rashes noted. Psychiatric: Normal mood and affect. Behavior is normal.  Labs 01/2019: CBC with low WBC of 2.9 and mildly low plts of 145.  Labs 05/13/22: CBC with low WBC of 3.5. CMP unremarkable. Hep B surface antigen reactive. Hep B surface antibody NR. HCV Ab NR. HIV NR  Labs 06/04/22: Hepatitis B DNA 1100. Hepatitis B core antibody positive.  Labs 06/2022: Iron levels nml. HDV RNA not detected. Hepatitis A antibody reactive. Hepatitis B E antigen NR. Hepatitis B E antibody reactive. Hepatitis D  antibody positive. INR mildly elevated at 1.1.   Labs 10/2022: LFTs nml. HBV DNA level 813.  Labs 11/2022: CBC with low plts of 139 and low WBC of 3.1  Labs 01/2023: LFTs nml with ALT of 13. HBV DNA 385.   Labs 05/2023: HBV DNA 2260. LFTs nml with ALT of 13.   Labs 06/2023: CBC with low plts of 134 and low WBC of 3.7. INR is 1.1.   Labs 11/2022: CBC with low WBC of 2.8 and low plt count of 132. CMP nml with ALT of 15.  Abd U/S with elastography 06/17/22: IMPRESSION: ULTRASOUND ABDOMEN: Echogenic liver, question fatty infiltration versus cirrhosis. Inadequate visualization of IVC and pancreas. ULTRASOUND HEPATIC ELASTOGRAPHY: Median kPa:  5.1 Diagnostic category: < or = 9 kPa: in the absence of other known clinical signs, rules out cACLD  RUQ US  05/21/23: IMPRESSION: No cholelithiasis or sonographic evidence for acute cholecystitis  Liver biopsy 06/21/23: A. LIVER, BIOPSY: -  Overall unremarkable liver parenchyma with minimal steatosis (less than 5%; mixed macro and microvesicular) and portal triads with minimal chronic portal inflammation without expansion or periportal hepatitis/piecemeal necrosis. Note: It is noted that the patient has a clinical history of hepatitis B.  There is no parenchymal damage and the portal triads are not expanded; however they do have a mild amount of chronic inflammatory cells without the presence of periportal chronic inflammation/piecemeal necrosis there is no significant fibrosis (trichrome stain reviewed).  A reticulin stain shows normal plate thickness.  There is no stainable iron.  A PAS stain is unremarkable.  Immunohistochemical  stains (performed at [facility] and reviewed by me) show no hepatitis B core antigen (HBcAg) expression.  There are clusters of hepatitis B surface antigen (HBsAg) positive cells showing some cytoplasmic staining.  The overall pattern of immunohistochemical staining is more in keeping with an inactive carrier  state as opposed to chronic hepatitis.  However, given the mild portal chronic inflammation present per Venus Ginsberg and Tildon Folds this would be staged as a grade 1 of 4 and stage 0 of 4.  Clinical correlation recommended.   Colonoscopy 06/22/17:  No path report available Rec: 5 year follow up  Colonoscopy 07/24/22:  Path: 1. Surgical [P], colon, transverse and cecum, polyp (3) TUBULAR ADENOMA, 4 FRAGMENTS NEGATIVE FOR HIGH-GRADE DYSPLASIA AND CARCINOMA 2. Surgical [P], colon, rectal lesion biopsies SQUAMOCOLONIC JUNCTIONAL TYPE MUCOSA WITH FOCAL REACTIVE/PROLAPSE CHANGES NEGATIVE FOR DYSPLASIA AND CARCINOMA    ASSESSMENT AND PLAN: Chronic hepatitis B History of thrombocytopenia History of colon polyp Patient's chronic hepatitis B has been stable in the an inactive carrier state. Her last HBV DNA was >2000, but her follow up liver biopsy confirmed only mild inflammation and no signs of fibrosis. Thus at this time patient does not need to start treatment for hepatitis B. Will continue surveillance by checking HBV DNA and ALT every 6 months and continue HCC screening. - Check HBV DNA today - Will get RUQ U/S for [facility] screening - Next colonoscopy in 07/2025 for history of polyps - RTC 6 months  Regino Caprio, MD  I spent 30 minutes of time, including in depth chart review, independent review of results as outlined above, communicating results with the patient directly, face-to-face time with the patient, coordinating care, and ordering studies and medications as appropriate, and documentation.

## 2023-12-10 LAB — HEPATITIS B DNA, ULTRAQUANTITATIVE, PCR
Hepatitis B DNA: 1070 [IU]/mL — ABNORMAL HIGH
Hepatitis B virus DNA: 3.03 {Log_IU}/mL — ABNORMAL HIGH

## 2023-12-11 ENCOUNTER — Encounter: Payer: Self-pay | Admitting: Internal Medicine

## 2023-12-14 ENCOUNTER — Ambulatory Visit (HOSPITAL_COMMUNITY)

## 2023-12-29 ENCOUNTER — Ambulatory Visit (HOSPITAL_COMMUNITY)
Admission: RE | Admit: 2023-12-29 | Discharge: 2023-12-29 | Disposition: A | Discharge status: Home / Self Care | Source: Ambulatory Visit | Attending: Internal Medicine | Admitting: Internal Medicine

## 2023-12-29 DIAGNOSIS — B181 Chronic viral hepatitis B without delta-agent: Secondary | ICD-10-CM | POA: Insufficient documentation

## 2023-12-29 DIAGNOSIS — K7689 Other specified diseases of liver: Secondary | ICD-10-CM | POA: Diagnosis not present

## 2023-12-29 DIAGNOSIS — Z862 Personal history of diseases of the blood and blood-forming organs and certain disorders involving the immune mechanism: Secondary | ICD-10-CM | POA: Diagnosis not present

## 2023-12-30 ENCOUNTER — Ambulatory Visit: Payer: Self-pay | Admitting: Internal Medicine

## 2024-01-21 DIAGNOSIS — Z0001 Encounter for general adult medical examination with abnormal findings: Secondary | ICD-10-CM | POA: Diagnosis not present

## 2024-01-21 DIAGNOSIS — R7303 Prediabetes: Secondary | ICD-10-CM | POA: Diagnosis not present

## 2024-01-21 DIAGNOSIS — E785 Hyperlipidemia, unspecified: Secondary | ICD-10-CM | POA: Diagnosis not present

## 2024-01-26 DIAGNOSIS — R7303 Prediabetes: Secondary | ICD-10-CM | POA: Diagnosis not present

## 2024-01-26 DIAGNOSIS — D72819 Decreased white blood cell count, unspecified: Secondary | ICD-10-CM | POA: Diagnosis not present

## 2024-01-26 DIAGNOSIS — J3089 Other allergic rhinitis: Secondary | ICD-10-CM | POA: Diagnosis not present

## 2024-01-26 DIAGNOSIS — D696 Thrombocytopenia, unspecified: Secondary | ICD-10-CM | POA: Diagnosis not present

## 2024-01-26 DIAGNOSIS — I10 Essential (primary) hypertension: Secondary | ICD-10-CM | POA: Diagnosis not present

## 2024-01-26 DIAGNOSIS — R768 Other specified abnormal immunological findings in serum: Secondary | ICD-10-CM | POA: Diagnosis not present

## 2024-01-26 DIAGNOSIS — E785 Hyperlipidemia, unspecified: Secondary | ICD-10-CM | POA: Diagnosis not present

## 2024-01-27 DIAGNOSIS — Z23 Encounter for immunization: Secondary | ICD-10-CM | POA: Diagnosis not present

## 2024-05-25 DIAGNOSIS — R7303 Prediabetes: Secondary | ICD-10-CM | POA: Diagnosis not present

## 2024-05-25 DIAGNOSIS — Z0001 Encounter for general adult medical examination with abnormal findings: Secondary | ICD-10-CM | POA: Diagnosis not present

## 2024-05-25 DIAGNOSIS — E785 Hyperlipidemia, unspecified: Secondary | ICD-10-CM | POA: Diagnosis not present

## 2024-05-25 DIAGNOSIS — E559 Vitamin D deficiency, unspecified: Secondary | ICD-10-CM | POA: Diagnosis not present

## 2024-08-07 ENCOUNTER — Other Ambulatory Visit: Payer: Self-pay | Admitting: Internal Medicine

## 2024-08-07 DIAGNOSIS — Z1231 Encounter for screening mammogram for malignant neoplasm of breast: Secondary | ICD-10-CM

## 2024-08-30 ENCOUNTER — Ambulatory Visit
Admission: RE | Admit: 2024-08-30 | Discharge: 2024-08-30 | Disposition: A | Source: Ambulatory Visit | Attending: Internal Medicine | Admitting: Internal Medicine

## 2024-08-30 DIAGNOSIS — Z1231 Encounter for screening mammogram for malignant neoplasm of breast: Secondary | ICD-10-CM

## 2024-09-13 ENCOUNTER — Ambulatory Visit
Admission: RE | Admit: 2024-09-13 | Discharge: 2024-09-13 | Disposition: A | Source: Ambulatory Visit | Attending: Internal Medicine | Admitting: Internal Medicine
